# Patient Record
Sex: Male | Born: 1955 | Race: White | Hispanic: No | Marital: Married | Smoking: Never smoker
Health system: Southern US, Community
[De-identification: ages and names within clinical notes are randomized; demographics above are authoritative.]

## PROBLEM LIST (undated history)

## (undated) ENCOUNTER — Ambulatory Visit: Admission: EM | Source: Home / Self Care

## (undated) DIAGNOSIS — K4090 Unilateral inguinal hernia, without obstruction or gangrene, not specified as recurrent: Secondary | ICD-10-CM

## (undated) DIAGNOSIS — Z85820 Personal history of malignant melanoma of skin: Secondary | ICD-10-CM

## (undated) DIAGNOSIS — N3289 Other specified disorders of bladder: Secondary | ICD-10-CM

## (undated) DIAGNOSIS — Z973 Presence of spectacles and contact lenses: Secondary | ICD-10-CM

## (undated) DIAGNOSIS — M1990 Unspecified osteoarthritis, unspecified site: Secondary | ICD-10-CM

## (undated) DIAGNOSIS — D039 Melanoma in situ, unspecified: Secondary | ICD-10-CM

## (undated) DIAGNOSIS — G250 Essential tremor: Secondary | ICD-10-CM

## (undated) DIAGNOSIS — Z9889 Other specified postprocedural states: Secondary | ICD-10-CM

## (undated) DIAGNOSIS — C801 Malignant (primary) neoplasm, unspecified: Secondary | ICD-10-CM

## (undated) DIAGNOSIS — D494 Neoplasm of unspecified behavior of bladder: Secondary | ICD-10-CM

## (undated) DIAGNOSIS — F419 Anxiety disorder, unspecified: Secondary | ICD-10-CM

## (undated) HISTORY — DX: Melanoma in situ, unspecified: D03.9

## (undated) HISTORY — PX: MELANOMA EXCISION: SHX5266

## (undated) HISTORY — PX: VASECTOMY: SHX75

## (undated) HISTORY — DX: Essential tremor: G25.0

## (undated) HISTORY — PX: OTHER SURGICAL HISTORY: SHX169

---
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
## 2005-11-25 HISTORY — PX: INGUINAL HERNIA REPAIR: SUR1180

## 2005-11-25 HISTORY — PX: OTHER SURGICAL HISTORY: SHX169

## 2015-05-12 DIAGNOSIS — C434 Malignant melanoma of scalp and neck: Secondary | ICD-10-CM | POA: Insufficient documentation

## 2015-11-26 HISTORY — PX: HAND SURGERY: SHX662

## 2016-03-11 ENCOUNTER — Ambulatory Visit: Payer: Self-pay | Admitting: General Surgery

## 2016-04-09 NOTE — Patient Instructions (Signed)
Raymond Wagner  04/09/2016   Your procedure is scheduled on: 04/17/16  Report to [facility] Main  Entrance take [facility]  elevators to 3rd floor to [place] at 6:30  AM.  Call this number if you have problems the morning of surgery [phone]   Remember: ONLY 1 PERSON MAY GO WITH YOU TO SHORT STAY TO GET  READY MORNING OF [place].  Do not eat food or drink liquids :After Midnight.     Take these medicines the morning of surgery with A SIP OF WATER: Zyrtec if needed                                You may not have any metal on your body including              piercings.  Do not wear jewelry, lotions, powders or deodorant                        Men may shave face and neck.   Do not bring valuables to the hospital. [place].  Contacts, dentures or bridgework may not be worn into surgery.  Leave suitcase in the car. After surgery it may be brought to your room.     _____________________________________________________________________             [facility] - Preparing for Surgery Before surgery, you can play an important role.  Because skin is not sterile, your skin needs to be as free of germs as possible.  You can reduce the number of germs on your skin by washing with CHG (chlorahexidine gluconate) soap before surgery.  CHG is an antiseptic cleaner which kills germs and bonds with the skin to continue killing germs even after washing. Please DO NOT use if you have an allergy to CHG or antibacterial soaps.  If your skin becomes reddened/irritated stop using the CHG and inform your nurse when you arrive at Short Stay. Do not shave (including legs and underarms) for at least 48 hours prior to the first CHG shower.  You may shave your face/neck. Please follow these instructions carefully:  1.  Shower with CHG Soap the night before surgery and the  morning of Surgery.  2.  If you choose to wash your  hair, wash your hair first as usual with your  normal  shampoo.  3.  After you shampoo, rinse your hair and body thoroughly to remove the  shampoo.                           4.  Use CHG as you would any other liquid soap.  You can apply chg directly  to the skin and wash                       Gently with a scrungie or clean washcloth.  5.  Apply the CHG Soap to your body ONLY FROM THE NECK DOWN.   Do not use on face/ open                           Wound or  open sores. Avoid contact with eyes, ears mouth and genitals (private parts).                       Wash face,  Genitals (private parts) with your normal soap.             6.  Wash thoroughly, paying special attention to the area where your surgery  will be performed.  7.  Thoroughly rinse your body with warm water from the neck down.  8.  DO NOT shower/wash with your normal soap after using and rinsing off  the CHG Soap.                9.  Pat yourself dry with a clean towel.            10.  Wear clean pajamas.            11.  Place clean sheets on your bed the night of your first shower and do not  sleep with pets. Day of Surgery : Do not apply any lotions/deodorants the morning of surgery.  Please wear clean clothes to the hospital/surgery center.  FAILURE TO FOLLOW THESE INSTRUCTIONS MAY RESULT IN THE CANCELLATION OF YOUR SURGERY PATIENT SIGNATURE_________________________________  NURSE SIGNATURE__________________________________  ________________________________________________________________________

## 2016-04-10 ENCOUNTER — Ambulatory Visit: Payer: Self-pay | Admitting: General Surgery

## 2016-04-12 ENCOUNTER — Encounter (HOSPITAL_COMMUNITY): Payer: Self-pay

## 2016-04-12 ENCOUNTER — Encounter (HOSPITAL_COMMUNITY)
Admission: RE | Admit: 2016-04-12 | Discharge: 2016-04-12 | Disposition: A | Discharge status: Home / Self Care | Payer: 59 | Source: Ambulatory Visit | Attending: General Surgery | Admitting: General Surgery

## 2016-04-12 DIAGNOSIS — Z01812 Encounter for preprocedural laboratory examination: Secondary | ICD-10-CM | POA: Insufficient documentation

## 2016-04-12 DIAGNOSIS — K4090 Unilateral inguinal hernia, without obstruction or gangrene, not specified as recurrent: Secondary | ICD-10-CM | POA: Diagnosis not present

## 2016-04-12 HISTORY — DX: Malignant (primary) neoplasm, unspecified: C80.1

## 2016-04-12 LAB — CBC
HCT: 39.1 % (ref 39.0–52.0)
Hemoglobin: 13.1 g/dL (ref 13.0–17.0)
MCH: 31.1 pg (ref 26.0–34.0)
MCHC: 33.5 g/dL (ref 30.0–36.0)
MCV: 92.9 fL (ref 78.0–100.0)
PLATELETS: 161 10*3/uL (ref 150–400)
RBC: 4.21 MIL/uL — ABNORMAL LOW (ref 4.22–5.81)
RDW: 13 % (ref 11.5–15.5)
WBC: 5.3 10*3/uL (ref 4.0–10.5)

## 2016-04-16 ENCOUNTER — Encounter (HOSPITAL_BASED_OUTPATIENT_CLINIC_OR_DEPARTMENT_OTHER): Payer: Self-pay | Admitting: *Deleted

## 2016-04-16 NOTE — Progress Notes (Signed)
INFORMED PT OF FACILITY CHANGE. NPO AFTER MN.  ARRIVE AT 0700.  CURRENT LAB RESULT IN CHART AND EPIC.  WILL DO HIBICLENS SHOWER HS BEFORE AND AM DOS.

## 2016-04-17 ENCOUNTER — Ambulatory Visit (HOSPITAL_BASED_OUTPATIENT_CLINIC_OR_DEPARTMENT_OTHER): Payer: 59 | Admitting: Anesthesiology

## 2016-04-17 ENCOUNTER — Encounter (HOSPITAL_BASED_OUTPATIENT_CLINIC_OR_DEPARTMENT_OTHER)
Admission: RE | Disposition: A | Discharge status: Home / Self Care | Payer: Self-pay | Source: Ambulatory Visit | Attending: General Surgery

## 2016-04-17 ENCOUNTER — Ambulatory Visit (HOSPITAL_BASED_OUTPATIENT_CLINIC_OR_DEPARTMENT_OTHER)
Admission: RE | Admit: 2016-04-17 | Discharge: 2016-04-17 | Disposition: A | Discharge status: Home / Self Care | Payer: 59 | Source: Ambulatory Visit | Attending: General Surgery | Admitting: General Surgery

## 2016-04-17 ENCOUNTER — Encounter (HOSPITAL_BASED_OUTPATIENT_CLINIC_OR_DEPARTMENT_OTHER): Payer: Self-pay | Admitting: Anesthesiology

## 2016-04-17 DIAGNOSIS — Z79899 Other long term (current) drug therapy: Secondary | ICD-10-CM | POA: Diagnosis not present

## 2016-04-17 DIAGNOSIS — D176 Benign lipomatous neoplasm of spermatic cord: Secondary | ICD-10-CM | POA: Diagnosis not present

## 2016-04-17 DIAGNOSIS — Z7982 Long term (current) use of aspirin: Secondary | ICD-10-CM | POA: Diagnosis not present

## 2016-04-17 DIAGNOSIS — Z85820 Personal history of malignant melanoma of skin: Secondary | ICD-10-CM | POA: Insufficient documentation

## 2016-04-17 DIAGNOSIS — K4090 Unilateral inguinal hernia, without obstruction or gangrene, not specified as recurrent: Secondary | ICD-10-CM | POA: Insufficient documentation

## 2016-04-17 HISTORY — DX: Other specified postprocedural states: Z98.890

## 2016-04-17 HISTORY — DX: Personal history of malignant melanoma of skin: Z85.820

## 2016-04-17 HISTORY — DX: Unilateral inguinal hernia, without obstruction or gangrene, not specified as recurrent: K40.90

## 2016-04-17 HISTORY — PX: INSERTION OF MESH: SHX5868

## 2016-04-17 HISTORY — PX: INGUINAL HERNIA REPAIR: SHX194

## 2016-04-17 SURGERY — REPAIR, HERNIA, INGUINAL, ADULT
Anesthesia: General | Site: Inguinal | Laterality: Left

## 2016-04-17 MED ORDER — MIDAZOLAM HCL 2 MG/2ML IJ SOLN
INTRAMUSCULAR | Status: AC
  Filled 2016-04-17: qty 2

## 2016-04-17 MED ORDER — PROPOFOL 10 MG/ML IV BOLUS
INTRAVENOUS | Status: AC
  Filled 2016-04-17: qty 40

## 2016-04-17 MED ORDER — LIDOCAINE HCL (CARDIAC) 20 MG/ML IV SOLN
INTRAVENOUS | Status: AC
  Filled 2016-04-17: qty 10

## 2016-04-17 MED ORDER — MIDAZOLAM HCL 5 MG/5ML IJ SOLN
INTRAMUSCULAR | Status: DC | PRN
  Administered 2016-04-17: 2 mg via INTRAVENOUS

## 2016-04-17 MED ORDER — CEFAZOLIN SODIUM-DEXTROSE 2-4 GM/100ML-% IV SOLN
2.0000 g | INTRAVENOUS | Status: AC
  Administered 2016-04-17: 2 g via INTRAVENOUS
  Filled 2016-04-17: qty 100

## 2016-04-17 MED ORDER — LIDOCAINE HCL (CARDIAC) 20 MG/ML IV SOLN
INTRAVENOUS | Status: AC
  Filled 2016-04-17: qty 15

## 2016-04-17 MED ORDER — FENTANYL CITRATE (PF) 100 MCG/2ML IJ SOLN
INTRAMUSCULAR | Status: DC | PRN
  Administered 2016-04-17: 50 ug via INTRAVENOUS

## 2016-04-17 MED ORDER — ONDANSETRON HCL 4 MG/2ML IJ SOLN
INTRAMUSCULAR | Status: AC
  Filled 2016-04-17: qty 2

## 2016-04-17 MED ORDER — HYDROMORPHONE HCL 1 MG/ML IJ SOLN
0.2500 mg | INTRAMUSCULAR | Status: DC | PRN
  Filled 2016-04-17: qty 1

## 2016-04-17 MED ORDER — OXYCODONE-ACETAMINOPHEN 5-325 MG PO TABS: 1.0000 | ORAL_TABLET | ORAL | Status: DC | PRN

## 2016-04-17 MED ORDER — LACTATED RINGERS IV SOLN
INTRAVENOUS | Status: DC
  Administered 2016-04-17 (×2): via INTRAVENOUS
  Filled 2016-04-17: qty 1000

## 2016-04-17 MED ORDER — ARTIFICIAL TEARS OP OINT
TOPICAL_OINTMENT | OPHTHALMIC | Status: AC
  Filled 2016-04-17: qty 10.5

## 2016-04-17 MED ORDER — EPHEDRINE SULFATE 50 MG/ML IJ SOLN
INTRAMUSCULAR | Status: AC
  Filled 2016-04-17: qty 1

## 2016-04-17 MED ORDER — KETOROLAC TROMETHAMINE 30 MG/ML IJ SOLN
30.0000 mg | Freq: Once | INTRAMUSCULAR | Status: DC | PRN
  Filled 2016-04-17: qty 1

## 2016-04-17 MED ORDER — PROPOFOL 10 MG/ML IV BOLUS
INTRAVENOUS | Status: DC | PRN
  Administered 2016-04-17: 200 mg via INTRAVENOUS

## 2016-04-17 MED ORDER — DEXAMETHASONE SODIUM PHOSPHATE 4 MG/ML IJ SOLN
INTRAMUSCULAR | Status: DC | PRN
  Administered 2016-04-17: 10 mg via INTRAVENOUS

## 2016-04-17 MED ORDER — DEXAMETHASONE SODIUM PHOSPHATE 10 MG/ML IJ SOLN
INTRAMUSCULAR | Status: AC
  Filled 2016-04-17: qty 1

## 2016-04-17 MED ORDER — HYDROCODONE-ACETAMINOPHEN 7.5-325 MG PO TABS
1.0000 | ORAL_TABLET | Freq: Once | ORAL | Status: DC | PRN
  Filled 2016-04-17: qty 1

## 2016-04-17 MED ORDER — FENTANYL CITRATE (PF) 100 MCG/2ML IJ SOLN
INTRAMUSCULAR | Status: AC
  Filled 2016-04-17: qty 4

## 2016-04-17 MED ORDER — PROMETHAZINE HCL 25 MG/ML IJ SOLN
6.2500 mg | INTRAMUSCULAR | Status: DC | PRN
  Filled 2016-04-17: qty 1

## 2016-04-17 MED ORDER — KETOROLAC TROMETHAMINE 30 MG/ML IJ SOLN
INTRAMUSCULAR | Status: AC
  Filled 2016-04-17: qty 1

## 2016-04-17 MED ORDER — LIDOCAINE 2% (20 MG/ML) 5 ML SYRINGE
INTRAMUSCULAR | Status: DC | PRN
  Administered 2016-04-17: 60 mg via INTRAVENOUS

## 2016-04-17 MED ORDER — ONDANSETRON HCL 4 MG/2ML IJ SOLN
INTRAMUSCULAR | Status: DC | PRN
  Administered 2016-04-17: 4 mg via INTRAVENOUS

## 2016-04-17 MED ORDER — BUPIVACAINE-EPINEPHRINE 0.25% -1:200000 IJ SOLN
INTRAMUSCULAR | Status: DC | PRN
  Administered 2016-04-17: 10 mL

## 2016-04-17 MED ORDER — SODIUM CHLORIDE 0.9 % IV SOLN
INTRAVENOUS | Status: DC | PRN
  Administered 2016-04-17: 40 mL

## 2016-04-17 MED ORDER — CEFAZOLIN SODIUM-DEXTROSE 2-4 GM/100ML-% IV SOLN
INTRAVENOUS | Status: AC
  Filled 2016-04-17: qty 100

## 2016-04-17 MED ORDER — CHLORHEXIDINE GLUCONATE 4 % EX LIQD
1.0000 "application " | Freq: Once | CUTANEOUS | Status: DC
  Filled 2016-04-17: qty 15

## 2016-04-17 MED ORDER — IBUPROFEN 800 MG PO TABS: 800.0000 mg | ORAL_TABLET | Freq: Three times a day (TID) | ORAL | Status: DC | PRN

## 2016-04-17 MED ORDER — EPHEDRINE SULFATE-NACL 50-0.9 MG/10ML-% IV SOSY
PREFILLED_SYRINGE | INTRAVENOUS | Status: DC | PRN
  Administered 2016-04-17: 15 mg via INTRAVENOUS
  Administered 2016-04-17 (×2): 10 mg via INTRAVENOUS

## 2016-04-17 MED ORDER — ROCURONIUM BROMIDE 100 MG/10ML IV SOLN
INTRAVENOUS | Status: AC
Start: 2016-04-17 — End: 2016-04-17
  Filled 2016-04-17: qty 1

## 2016-04-17 SURGICAL SUPPLY — 47 items
BLADE CLIPPER SENSICLIP SURGIC (BLADE) ×3 IMPLANT
BLADE HEX COATED 2.75 (ELECTRODE) ×3 IMPLANT
CANISTER SUCTION 2500CC (MISCELLANEOUS) IMPLANT
CELLS DAT CNTRL 66122 CELL SVR (MISCELLANEOUS) IMPLANT
CHLORAPREP W/TINT 26ML (MISCELLANEOUS) ×3 IMPLANT
CONT SPECI 4OZ STER CLIK (MISCELLANEOUS) ×3 IMPLANT
COVER BACK TABLE 60X90IN (DRAPES) ×3 IMPLANT
COVER MAYO STAND STRL (DRAPES) ×3 IMPLANT
DECANTER SPIKE VIAL GLASS SM (MISCELLANEOUS) IMPLANT
DRAIN PENROSE 18X1/4 LTX STRL (WOUND CARE) IMPLANT
DRAPE LAPAROSCOPIC ABDOMINAL (DRAPES) ×3 IMPLANT
DRAPE UTILITY XL STRL (DRAPES) ×3 IMPLANT
ELECT REM PT RETURN 9FT ADLT (ELECTROSURGICAL) ×3
ELECTRODE REM PT RTRN 9FT ADLT (ELECTROSURGICAL) ×1 IMPLANT
GLOVE BIOGEL PI IND STRL 7.0 (GLOVE) ×1 IMPLANT
GLOVE BIOGEL PI IND STRL 7.5 (GLOVE) ×3 IMPLANT
GLOVE BIOGEL PI INDICATOR 7.0 (GLOVE) ×2
GLOVE BIOGEL PI INDICATOR 7.5 (GLOVE) ×6
GLOVE SURG SS PI 7.0 STRL IVOR (GLOVE) ×3 IMPLANT
GOWN STRL REUS W/ TWL LRG LVL3 (GOWN DISPOSABLE) ×1 IMPLANT
GOWN STRL REUS W/TWL LRG LVL3 (GOWN DISPOSABLE) ×5 IMPLANT
KIT ROOM TURNOVER WOR (KITS) ×3 IMPLANT
LIQUID BAND (GAUZE/BANDAGES/DRESSINGS) ×3 IMPLANT
MESH BARD SOFT 3X6IN (Mesh General) ×3 IMPLANT
NEEDLE HYPO 25X1 1.5 SAFETY (NEEDLE) ×3 IMPLANT
NS IRRIG 500ML POUR BTL (IV SOLUTION) ×3 IMPLANT
PACK BASIN DAY SURGERY FS (CUSTOM PROCEDURE TRAY) ×3 IMPLANT
PAD ARMBOARD 7.5X6 YLW CONV (MISCELLANEOUS) ×3 IMPLANT
PENCIL BUTTON HOLSTER BLD 10FT (ELECTRODE) ×3 IMPLANT
RTRCTR WOUND ALEXIS 18CM MED (MISCELLANEOUS)
RTRCTR WOUND ALEXIS 18CM SML (INSTRUMENTS)
SAVER CELL AAL HAEMONETICS (INSTRUMENTS) IMPLANT
SPONGE LAP 4X18 X RAY DECT (DISPOSABLE) IMPLANT
SUT MNCRL AB 4-0 PS2 18 (SUTURE) ×3 IMPLANT
SUT PROLENE 2 0 CT2 30 (SUTURE) ×9 IMPLANT
SUT SILK 3 0 SH 30 (SUTURE) ×3 IMPLANT
SUT VIC AB 2-0 SH 27 (SUTURE) ×6
SUT VIC AB 2-0 SH 27X BRD (SUTURE) ×2 IMPLANT
SUT VIC AB 2-0 SH 27XBRD (SUTURE) ×1 IMPLANT
SUT VIC AB 3-0 SH 27 (SUTURE) ×2
SUT VIC AB 3-0 SH 27X BRD (SUTURE) ×1 IMPLANT
SYR BULB IRRIGATION 50ML (SYRINGE) ×3 IMPLANT
SYR CONTROL 10ML LL (SYRINGE) ×6 IMPLANT
TOWEL OR 17X24 6PK STRL BLUE (TOWEL DISPOSABLE) ×6 IMPLANT
TUBE CONNECTING 12'X1/4 (SUCTIONS) ×1
TUBE CONNECTING 12X1/4 (SUCTIONS) ×2 IMPLANT
YANKAUER SUCT BULB TIP NO VENT (SUCTIONS) ×3 IMPLANT

## 2016-04-17 NOTE — Op Note (Signed)
Preop diagnosis: left inguinal hernia  Postop diagnosis: left direct inguinal hernia  Procedure: open Left inguinal hernia repair with mesh  Surgeon: Gurney Maxin, M.D.  Asst: none  Anesthesia: Gen.   Indications for procedure: Raymond Wagner is a 60 y.o. male with symptoms of pain and enlarging Left inguinal hernia(s). After discussing risks, alternatives and benefits he decided on open repair and was brought to day surgery for repair.  Description of procedure: The patient was brought into the operative suite, placed supine. Anesthesia was administered with endotracheal tube. Patient was strapped in place. The patient was prepped and draped in the usual sterile fashion.  The anterior superior iliac spine and pubic tubercle were identified on the Left side. An incision was made 1cm above the connecting line, representative of the location of the inguinal ligament. The subcutaneous tissue was bluntly dissected, scarpa's fascia was dissected away. The external abdominal oblique fascia was identified and sharply opened down to the external inguinal ring. The conjoint tendon and inguinal ligament were identified. The cord structures and sac were dissected free of the surrounding tissue in 360 degrees. A penrose drain was used to encircle the contents. The cremasteric fibers were dissected free of the contents of the cord and hernia sac. The cord structures (vessels and vas deferens) were identified and carefully dissected away from the hernia sac. The hernia was in the direct space. There was also a lipoma of the cord dissected free of the cord and removed. Preperitoneal fat was identified showing appropriate dissection. The superior aspect of the external oblique fascia cut showed the ilioinguinal nerve. Due to concern of this lying over the mesh this was ligated with a 3-0 silk and cut. The floor was reinforced first by suturing the conjoint tendon to the inguinal ligament in running fashion with a  2-0 prolene. A 3x6 Bard Soft mesh was then used to close the defect and reinforce the floor. The mesh was sutured to the lacunar ligament and inguinal ligament using a 2-0 prolene in running fashion. Next the superior edge of the mesh was sutured to the conjoined tendon using a 2-0 running Prolene. An additional 2-0 Prolene was used to suture the tail ends of the mesh together re-creating the deep ring. Cord structures are running in a neutral position through the mesh. Next the external abdominal oblique fascia was closed with a 2-0 Vicryl in running fashion to re-create the external inguinal ring. Scarpa's fascia was closed with 3-0 Vicryl in running fashion. Skin was closed with a 4-0 Monocryl subcuticular stitch in running fashion. Dermabond place for dressing. Patient woke from anesthesia and brought to PACU in stable condition. All counts are correct.    Findings: left direct inguinal hernia  Specimen: none  Blood loss: <50 ml  Local anesthesia: 40 ml 1:1 Exparel:Saline, 77ml 0.25% marcaine w epi  Complications: none  Implant: 7x15cm bard soft mesh  Gurney Maxin, M.D. General, Bariatric, & Minimally Invasive Surgery [facility] Surgery, [place] 9:43 AM 04/17/2016

## 2016-04-17 NOTE — Anesthesia Postprocedure Evaluation (Signed)
Anesthesia Post Note  Patient: Raymond Wagner  Procedure(s) Performed: Procedure(s) (LRB): OPEN LEFT IINGUINAL HERNIA REPAIR (Left) INSERTION OF MESH (Left)  Patient location during evaluation: PACU Anesthesia Type: General Level of consciousness: awake and alert Pain management: pain level controlled Vital Signs Assessment: post-procedure vital signs reviewed and stable Respiratory status: spontaneous breathing, nonlabored ventilation, respiratory function stable and patient connected to nasal cannula oxygen Cardiovascular status: blood pressure returned to baseline and stable Postop Assessment: no signs of nausea or vomiting Anesthetic complications: no    Last Vitals:  Filed Vitals:   04/17/16 1030 04/17/16 1045  BP: 121/69 122/69  Pulse: 63 67  Temp:    Resp: 14 11    Last Pain: There were no vitals filed for this visit.               Tiajuana Amass

## 2016-04-17 NOTE — H&P (Signed)
History of Present Illness Patient words: New- LIH.  The patient is a 60 year old male who presents with an inguinal hernia. Referred by Shirline Frees. He has a 2 month history of noticing a bulge at his left groin. He had a previous right hernia repair 10 years ago by open fashion. Currently, he has no symptoms on the right side. Left side daily. He does heavy lifting at work and at the end of that they will have a burning sensation in that area. He denies nausea or vomiting. He has not had to manually reduce the hernia.  He does not smoke, he is not overweight, does not have diabetes, and he has no known collagen deficiency disorders.   Other Problems Hemorrhoids Inguinal Hernia Melanoma  Past Surgical HistoryOpen Inguinal Hernia Surgery Right. Vasectomy  Diagnostic Studies History Colonoscopy 1-5 years ago  Allergies  No Known Drug Allergies03/24/2017  Medication HistoryTriamcinolone Acetonide (0.1% Ointment, External) Active. Multivitamin Adult (Oral) Active. Aspirin (81MG  Tablet, Oral) Active. ZyrTEC (10MG  Tablet, Oral) Active. Medications Reconciled  Social History Alcohol use Occasional alcohol use. Caffeine use Carbonated beverages, Tea. No drug use Tobacco use Never smoker.  Family History  Arthritis Mother. Cancer Father. Diabetes Mellitus Sister.    Review of Systems  General Not Present- Appetite Loss, Chills, Fatigue, Fever, Night Sweats, Weight Gain and Weight Loss. Skin Not Present- Change in Wart/Mole, Dryness, Hives, Jaundice, New Lesions, Non-Healing Wounds, Rash and Ulcer. HEENT Present- Wears glasses/contact lenses. Not Present- Earache, Hearing Loss, Hoarseness, Nose Bleed, Oral Ulcers, Ringing in the Ears, Seasonal Allergies, Sinus Pain, Sore Throat, Visual Disturbances and Yellow Eyes. Respiratory Not Present- Bloody sputum, Chronic Cough, Difficulty Breathing, Snoring and Wheezing. Breast Not Present- Breast Mass, Breast  Pain, Nipple Discharge and Skin Changes. Cardiovascular Not Present- Chest Pain, Difficulty Breathing Lying Down, Leg Cramps, Palpitations, Rapid Heart Rate, Shortness of Breath and Swelling of Extremities. Gastrointestinal Present- Hemorrhoids. Not Present- Abdominal Pain, Bloating, Bloody Stool, Change in Bowel Habits, Chronic diarrhea, Constipation, Difficulty Swallowing, Excessive gas, Gets full quickly at meals, Indigestion, Nausea, Rectal Pain and Vomiting. Male Genitourinary Not Present- Blood in Urine, Change in Urinary Stream, Frequency, Impotence, Nocturia, Painful Urination, Urgency and Urine Leakage. Musculoskeletal Not Present- Back Pain, Joint Pain, Joint Stiffness, Muscle Pain, Muscle Weakness and Swelling of Extremities. Neurological Not Present- Decreased Memory, Fainting, Headaches, Numbness, Seizures, Tingling, Tremor, Trouble walking and Weakness. Psychiatric Not Present- Anxiety, Bipolar, Change in Sleep Pattern, Depression, Fearful and Frequent crying. Endocrine Not Present- Cold Intolerance, Excessive Hunger, Hair Changes, Heat Intolerance, Hot flashes and New Diabetes. Hematology Not Present- Easy Bruising, Excessive bleeding, Gland problems, HIV and Persistent Infections.  Vitals  BP 143/76 mmHg  Pulse 71  Temp(Src) 97.9 F (36.6 C) (Oral)  Resp 16  Wt 74.39 kg (164 lb)  SpO2 100%  Physical Exam  General Mental Status-Alert. General Appearance-Cooperative. Orientation-Oriented X4. Build & Nutrition-Obese. Posture-Normal posture.  Integumentary Global Assessment Normal Exam - Head/Face: no rashes, ulcers, lesions or evidence of photo damage. No palpable nodules or masses and Neck: no visible lesions or palpable masses.  Head and Neck Head-normocephalic, atraumatic with no lesions or palpable masses. Face Global Assessment - atraumatic. Thyroid Gland Characteristics - normal size and consistency.  Eye Eyeball - Bilateral-Extraocular  movements intact. Sclera/Conjunctiva - Bilateral-No scleral icterus, No Discharge.  ENMT Nose and Sinuses Nose - no deformities observed, no swelling present.  Chest and Lung Exam Palpation Normal exam - Non-tender. Auscultation Breath sounds - Normal.  Cardiovascular Auscultation Rhythm - Regular. Heart  Sounds - S1 WNL and S2 WNL. Carotid arteries - No Carotid bruit.  Abdomen Inspection Normal Exam - No Visible peristalsis, No Abnormal pulsations and No Paradoxical movements. Palpation/Percussion Normal exam - Soft, Non Tender, No Rebound tenderness, No Rigidity (guarding), No hepatosplenomegaly and No Palpable abdominal masses. Note: Right groin scar well-healed, no right fascial defect. Left groin with palpable bulge and 1.5 cm fascial defect once reduced in supine position.   Peripheral Vascular Upper Extremity Palpation - Pulses bilaterally normal. Lower Extremity Palpation - Edema - Bilateral - No edema.  Neurologic Neurologic evaluation reveals -normal sensation and normal coordination.  Neuropsychiatric Mental status exam performed with findings of-able to articulate well with normal speech/language, rate, volume and coherence and thought content normal with ability to perform basic computations and apply abstract reasoning.  Musculoskeletal Normal Exam - Bilateral-Upper Extremity Strength Normal and Lower Extremity Strength Normal.  Assessment & Plan  LEFT INGUINAL HERNIA (K40.90) Impression: 60 year old male with left inguinal hernia causing symptoms 3-5 times per week The patient has a symptomatic reducible hernia. We discussed the etiology of his hernia, the risk of it enlarging, incarceration, obstruction, strangulation, and that it is unlikely to get smaller or better on its own. We discussed operative options of laparoscopic vs open repair with mesh including the risks of recurrence, injury to intestines or abominal organs, chronic pain associated  with mesh, ischemic orchiditis or testicular injury. We decided to proceed with left open inguinal hernia. His wife is undergoing surgery at this month and he is concerned about leaving work and may delay the surgery for a few months to figure things out. Current Plans You are being scheduled for surgery - Our schedulers will call you.  You should hear from our office's scheduling department within 5 working days about the location, date, and time of surgery. We try to make accommodations for patient's preferences in scheduling surgery, but sometimes the OR schedule or the surgeon's schedule prevents [place] from making those accommodations.  If you have not heard from our office [phone]) in 5 working days, call the office and ask for your surgeon's nurse.  If you have other questions about your diagnosis, plan, or surgery, call the office and ask for your surgeon's nurse.  Pt Education - Pamphlet Given - Hernia Surgery: discussed with patient and provided information. Pt Education - Consent for inguinal hernia - Alanmichael Barmore: discussed with patient and provided information.

## 2016-04-17 NOTE — Transfer of Care (Signed)
Immediate Anesthesia Transfer of Care Note  Patient: Raymond Wagner  Procedure(s) Performed: Procedure(s): OPEN LEFT IINGUINAL HERNIA REPAIR (Left) INSERTION OF MESH (Left)  Patient Location: PACU  Anesthesia Type:General  Level of Consciousness: awake, alert , oriented and patient cooperative  Airway & Oxygen Therapy: Patient Spontanous Breathing and Patient connected to nasal cannula oxygen  Post-op Assessment: Report given to RN and Post -op Vital signs reviewed and stable  Post vital signs: Reviewed and stable  Last Vitals:  Filed Vitals:   04/17/16 0715 04/17/16 0951  BP: 143/76 127/58  Pulse: 71 72  Temp: 36.6 C 36.3 C  Resp: 16 14    Last Pain: There were no vitals filed for this visit.    Patients Stated Pain Goal: 4 ([id number])  Complications: No apparent anesthesia complications

## 2016-04-17 NOTE — Interval H&P Note (Signed)
History and Physical Interval Note:  04/17/2016 8:14 AM  Raymond Wagner  has presented today for surgery, with the diagnosis of left inguinal hernia  The various methods of treatment have been discussed with the patient and family. After consideration of risks, benefits and other options for treatment, the patient has consented to  Procedure(s): OPEN LEFT [place] (Left) INSERTION OF MESH (Left) as a surgical intervention .  The patient's history has been reviewed, patient examined, no change in status, stable for surgery.  I have reviewed the patient's chart and labs.  Questions were answered to the patient's satisfaction.     Arta Bruce [name]

## 2016-04-17 NOTE — Anesthesia Procedure Notes (Signed)
Procedure Name: LMA Insertion Date/Time: 04/17/2016 8:27 AM Performed by: Wanita Chamberlain Pre-anesthesia Checklist: Patient identified, Timeout performed, Emergency Drugs available, Suction available and Patient being monitored Patient Re-evaluated:Patient Re-evaluated prior to inductionOxygen Delivery Method: Circle system utilized Preoxygenation: Pre-oxygenation with 100% oxygen Intubation Type: IV induction Ventilation: Mask ventilation without difficulty LMA: LMA inserted LMA Size: 5.0 Number of attempts: 1 Placement Confirmation: breath sounds checked- equal and bilateral and positive ETCO2 Tube secured with: Tape Dental Injury: Teeth and Oropharynx as per pre-operative assessment

## 2016-04-17 NOTE — Anesthesia Preprocedure Evaluation (Addendum)
Anesthesia Evaluation  Patient identified by MRN, date of birth, ID band Patient awake    Reviewed: Allergy & Precautions, NPO status , Patient's Chart, lab work & pertinent test results  Airway Mallampati: II  TM Distance: >3 FB Neck ROM: Full    Dental  (+) Dental Advisory Given,    Pulmonary neg pulmonary ROS,    Pulmonary exam normal        Cardiovascular negative cardio ROS   Rhythm:Regular Rate:Normal     Neuro/Psych negative neurological ROS     GI/Hepatic negative GI ROS, Neg liver ROS,   Endo/Other  negative endocrine ROS  Renal/GU negative Renal ROS     Musculoskeletal   Abdominal   Peds  Hematology negative hematology ROS (+)   Anesthesia Other Findings   Reproductive/Obstetrics                          Lab Results  Component Value Date   WBC 5.3 04/12/2016   HGB 13.1 04/12/2016   HCT 39.1 04/12/2016   MCV 92.9 04/12/2016   PLT 161 04/12/2016   No results found for: CREATININE, BUN, NA, K, CL, CO2  Anesthesia Physical Anesthesia Plan  ASA: I  Anesthesia Plan: General   Post-op Pain Management:    Induction: Intravenous  Airway Management Planned: Oral ETT and LMA  Additional Equipment:   Intra-op Plan:   Post-operative Plan: Extubation in OR  Informed Consent: I have reviewed the patients History and Physical, chart, labs and discussed the procedure including the risks, benefits and alternatives for the proposed anesthesia with the patient or authorized representative who has indicated his/her understanding and acceptance.   Dental advisory given  Plan Discussed with: CRNA  Anesthesia Plan Comments:         Anesthesia Quick Evaluation

## 2016-04-17 NOTE — Discharge Instructions (Signed)
°  Post Anesthesia Home Care Instructions  Activity: Get plenty of rest for the remainder of the day. A responsible adult should stay with you for 24 hours following the procedure.  For the next 24 hours, DO NOT: -Drive a car -Operate machinery -Drink alcoholic beverages -Take any medication unless instructed by your physician -Make any legal decisions or sign important papers.  Meals: Start with liquid foods such as gelatin or soup. Progress to regular foods as tolerated. Avoid greasy, spicy, heavy foods. If nausea and/or vomiting occur, drink only clear liquids until the nausea and/or vomiting subsides. Call your physician if vomiting continues.  Special Instructions/Symptoms: Your throat may feel dry or sore from the anesthesia or the breathing tube placed in your throat during surgery. If this causes discomfort, gargle with warm salt water. The discomfort should disappear within 24 hours.  If you had a scopolamine patch placed behind your ear for the management of post- operative nausea and/or vomiting:  1. The medication in the patch is effective for 72 hours, after which it should be removed.  Wrap patch in a tissue and discard in the trash. Wash hands thoroughly with soap and water. 2. You may remove the patch earlier than 72 hours if you experience unpleasant side effects which may include dry mouth, dizziness or visual disturbances. 3. Avoid touching the patch. Wash your hands with soap and water after contact with the patch.   Information for Discharge Teaching: EXPAREL (bupivacaine liposome injectable suspension)   Your surgeon gave you EXPAREL(bupivacaine) in your surgical incision to help control your pain after surgery.   EXPAREL is a local anesthetic that provides pain relief by numbing the tissue around the surgical site.  EXPAREL is designed to release pain medication over time and can control pain for up to 72 hours.  Depending on how you respond to EXPAREL, you may  require less pain medication during your recovery.  Possible side effects:  Temporary loss of sensation or ability to move in the area where bupivacaine was injected.  Nausea, vomiting, constipation  Rarely, numbness and tingling in your mouth or lips, lightheadedness, or anxiety may occur.  Call your doctor right away if you think you may be experiencing any of these sensations, or if you have other questions regarding possible side effects.  Follow all other discharge instructions given to you by your surgeon or nurse. Eat a healthy diet and drink plenty of water or other fluids.  If you return to the hospital for any reason within 96 hours following the administration of EXPAREL, please inform your health care providers. 

## 2016-04-18 ENCOUNTER — Encounter (HOSPITAL_BASED_OUTPATIENT_CLINIC_OR_DEPARTMENT_OTHER): Payer: Self-pay | Admitting: General Surgery

## 2018-04-01 ENCOUNTER — Telehealth: Payer: Self-pay | Admitting: Neurology

## 2018-04-01 ENCOUNTER — Encounter: Payer: Self-pay | Admitting: Neurology

## 2018-04-01 ENCOUNTER — Ambulatory Visit (INDEPENDENT_AMBULATORY_CARE_PROVIDER_SITE_OTHER): Payer: BLUE CROSS/BLUE SHIELD | Admitting: Neurology

## 2018-04-01 VITALS — BP 159/86 | HR 54 | Wt 167.0 lb

## 2018-04-01 DIAGNOSIS — R251 Tremor, unspecified: Secondary | ICD-10-CM

## 2018-04-01 NOTE — Telephone Encounter (Signed)
[employer] Auth: [id number] (exp. 04/01/18 to 05/30/18) order sent to GI. They will contact the pt to schedule.

## 2018-04-01 NOTE — Patient Instructions (Signed)
Your tremor is mild, more noticeable on the left.  As discussed, I would like to follow you clinically for now, I would not recommend any new medication as yet.  We will do a brain scan, called MRI and call you with the test results. We will have to schedule you for this on a separate date. This test requires authorization from your insurance, and we will take care of the insurance process.

## 2018-04-01 NOTE — Progress Notes (Signed)
Subjective:    Patient ID: Raymond Wagner is a 62 y.o. male.  HPI     Star Age, MD, PhD [facility] [address], [address] [place], [place] [address]  Dear Dr. Kenton Kingfisher,   I saw your patient, Raymond Wagner, upon your kind request in my neurologic clinic today for initial consultation of his tremor. The patient is unaccompanied today. As you know, Raymond Wagner is a 62 year old right-handed gentleman with an underlying medical history of melanoma reports an approximately five-year history of bilateral hand tremors with recent increase noted which is bothersome to him at times. He has noted the tremor more on the left than the right and perhaps in the past year or so it seems to have progressed. He has noticed trembling when he tries to hold something. I reviewed your office note from 02/12/2018. He is currently not on any prescription medications. He has regular blood work 65-year-old office. Blood test results from 02/09/2018 were reviewed. CBC with differential was unremarkable, lipid panel showed cholesterol of 201, LDL of 118, triglycerides 95. TSH was borderline elevated at 4.58. PSA was unremarkable.  He is married and lives with his wife and daughter. He is a nonsmoker and drinks alcohol very occasionally, caffeine in the form ofoccasional soda, no coffee. He does not have a family history of tremors, father had Parkinson's disease and was diagnosed in his late 10s, lift to be 62 years old. Patient has 2 sisters, neither one with tremor. He has not noticed any changes in his walking, no difficulty swallowing, no recurrent headaches, feels sometimes weaker in both arms, has some right shoulder issues, presumed arthritis. He had injured his back when he was a teenager. He had an x-ray for this. He has never had a brain scan or head CT. He has not had any recent falls. He does not notice any balance issues, he is on his feet a lot, does not do any formal  exercise regimen but at work he is on his feet and walks a lot. 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 His handwriting is normal always very good he believes. He has not noticed any changes in his ability to speak.   His Past Medical History Is Significant For: Past Medical History:  Diagnosis Date  . Familial tremor   . History of melanoma excision    SCALP 2016  . Left inguinal hernia   . Melanoma in situ [facility])     His Past Surgical History Is Significant For: Past Surgical History:  Procedure Laterality Date  . INGUINAL HERNIA REPAIR Left 04/17/2016   Procedure: OPEN LEFT [place];  Surgeon: Mickeal Skinner, MD;  Location: [facility];  Service: General;  Laterality: Left;  . INSERTION OF MESH Left 04/17/2016   Procedure: INSERTION OF MESH;  Surgeon: Mickeal Skinner, MD;  Location: [facility];  Service: General;  Laterality: Left;  Marland Kitchen MELANOMA EXCISION  2016  and 2007   scalp area    His Family History Is Significant For: Family History  Problem Relation Age of Onset  . Hyperlipidemia Mother   . Hypertension Mother   . Bladder Cancer Father   . Parkinson's disease Father   . Diabetes Sister   . Heart attack Maternal Grandfather     His Social History Is Significant For: Social History   Socioeconomic History  . Marital status: Married    Spouse name: Not on file  . Number  of children: Not on file  . Years of education: Not on file  . Highest education level: Not on file  Occupational History  . Not on file  Social Needs  . Financial resource strain: Not on file  . Food insecurity:    Worry: Not on file    Inability: Not on file  . Transportation needs:    Medical: Not on file    Non-medical: Not on file  Tobacco Use  . Smoking status: Never Smoker  . Smokeless tobacco: Never Used  Substance and Sexual Activity  . Alcohol use: No  . Drug use: No  . Sexual activity: Not on file  Lifestyle  .  Physical activity:    Days per week: Not on file    Minutes per session: Not on file  . Stress: Not on file  Relationships  . Social connections:    Talks on phone: Not on file    Gets together: Not on file    Attends religious service: Not on file    Active member of club or organization: Not on file    Attends meetings of clubs or organizations: Not on file    Relationship status: Not on file  Other Topics Concern  . Not on file  Social History Narrative  . Not on file    His Allergies Are:  No Known Allergies:   His Current Medications Are:  Outpatient Encounter Medications as of 04/01/2018  Medication Sig  . aspirin EC 81 MG tablet Take 81 mg by mouth daily.  . cetirizine (ZYRTEC) 10 MG tablet Take 10 mg by mouth daily.  Marland Kitchen ibuprofen (ADVIL) 200 MG tablet Take 400 mg by mouth every 6 (six) hours as needed (For body aches.).  Marland Kitchen ibuprofen (ADVIL,MOTRIN) 800 MG tablet Take 1 tablet (800 mg total) by mouth every 8 (eight) hours as needed.  . Multiple Vitamin (MULTIVITAMIN WITH MINERALS) TABS tablet Take 1 tablet by mouth daily.  Marland Kitchen triamcinolone ointment (KENALOG) 0.1 % Apply 1 application topically 2 (two) times daily as needed (For eczema on legs.).   Marland Kitchen VEREGEN 15 % OINT Apply 1 application topically daily. For wart removal  . Zinc Sulfate 220 (50 Zn) MG TABS Take 220 mg by mouth 2 (two) times daily as needed. For nausea.   No facility-administered encounter medications on file as of 04/01/2018.    Review of Systems:  Out of a complete 14 point review of systems, all are reviewed and negative with the exception of these symptoms as listed below:  Review of Systems  Neurological:       Patient reports that the tremors started about 5 years ago and have gotten progressively worse or more noticeable.     Objective:  Neurological Exam  Physical Exam Physical Examination:   Vitals:   04/01/18 0948  BP: (!) 159/86  Pulse: (!) 54   General Examination: The patient is a very  pleasant 62 y.o. male in no acute distress. He appears well-developed and well-nourished and well groomed.   HEENT: Normocephalic, atraumatic, pupils are equal, round and reactive to light and accommodation. Funduscopic exam is normal with sharp disc margins noted. He has corrective eyeglasses. Extraocular tracking is good. He may have slight facial masking and slight decrease in eye blink rate, could be normal for him. Hearing is grossly intact.no dysarthria, no hypophonia. There is no lip, neck/head, jaw or voice tremor. Neck is supple or may have slight increase in nuchal tone, good range of motion passively  and actively.. There are no carotid bruits on auscultation. Oropharynx exam reveals: mild mouth dryness, adequate dental hygiene. Tongue protrudes centrally and palate elevates symmetrically.   Chest: Clear to auscultation without wheezing, rhonchi or crackles noted.  Heart: S1+S2+0, regular and normal without murmurs, rubs or gallops noted.   Abdomen: Soft, non-tender and non-distended with normal bowel sounds appreciated on auscultation.  Extremities: There is no pitting edema in the distal lower extremities bilaterally. Pedal pulses are intact.  Skin: Warm and dry without trophic changes noted.   Musculoskeletal: exam reveals no obvious joint deformities, tenderness or joint swelling or erythema.   Neurologically:  Mental status: The patient is awake, alert and oriented in all 4 spheres. His immediate and remote memory, attention, language skills and fund of knowledge are appropriate. There is no evidence of aphasia, agnosia, apraxia or anomia. Speech is clear with normal prosody and enunciation. Thought process is linear. Mood is normal and affect is normal.  Cranial nerves II - XII are as described above under HEENT exam. In addition: shoulder shrug is normal with equal shoulder height noted. Motor exam: Normal bulk, strength and tone is noted. There is no drift, tremor or rebound.   On 04/01/2018: on Archimedes spiral drawing he has minimal difficulty with the right hand, more pronounced on the left. Handwriting with the right hand is legible, quite small, not particularly tremulous.  He has a no resting tremor. He has a mild left hand postural tremor, minimal on the right, very mild action tremor bilaterally, slight intention tremor component bilaterally, no lower extremity tremor. Romberg is negative with the exception of very slight sway. Reflexes are 2+ throughout. Fine motor skills and coordination: intact with normal finger taps, normal hand movements, normal rapid alternating patting, normal foot taps and normal foot agility, with the exception of very slight difficulty with finger taps in the left hand.  Cerebellar testing: No dysmetria or intention tremor on finger to nose testing. There is no truncal or gait ataxia.  Sensory exam: intact to light touch in the upper and lower extremities.  Gait, station and balance: He stands without difficulty, posture is age-appropriate or very slightly stooped. Gait shows fairly good stride length, maybe slight decrease in pace, slight decrease in arm swing noted on the left. No difficulty turning. Balance is preserved.  Assessment and Plan:   In summary, Raymond Wagner is a very pleasant 62 y.o.-year old male with a Benign medical history, who presents for consultation of his hand tremors. He reports a 5 or 5+ year history of bilateral hand tremors, mostly on the left side with progression noted in the past year. His history and exam are not telltale for essential tremor. He does not have any family history of tremors either. Given his family history of Parkinson's disease and very subtle changes noted on examination I would like to observe him and follow him clinically. I did not suggest any medication for tremor control today. I would like to pursue a brain MRI with and without contrast to rule out any structural causes of his tremor.  He is advised that tremor is overall very mild at this point. We should continue to monitor however.  To that end, I would like to see him back in 6 months, sooner if needed. We will call him in the interim with his MRI results. I answered all his questions today and he was in agreement.  Thank you very much for allowing me to participate in the care of this  nice patient. If I can be of any further assistance to you please do not hesitate to call me at [phone].  Sincerely,   Star Age, MD, PhD

## 2018-04-14 ENCOUNTER — Ambulatory Visit
Admission: RE | Admit: 2018-04-14 | Discharge: 2018-04-14 | Disposition: A | Discharge status: Home / Self Care | Payer: 59 | Source: Ambulatory Visit | Attending: Neurology | Admitting: Neurology

## 2018-04-14 DIAGNOSIS — R251 Tremor, unspecified: Secondary | ICD-10-CM | POA: Diagnosis not present

## 2018-04-14 MED ORDER — GADOBENATE DIMEGLUMINE 529 MG/ML IV SOLN
15.0000 mL | Freq: Once | INTRAVENOUS | Status: AC | PRN
  Administered 2018-04-14: 15 mL via INTRAVENOUS

## 2018-04-16 ENCOUNTER — Telehealth: Payer: Self-pay

## 2018-04-16 NOTE — Progress Notes (Signed)
Normal MRI w/wo contrast, pls notify pt.  Raymond Age, MD, PhD [facility])

## 2018-04-16 NOTE — Telephone Encounter (Signed)
-----   Message from Star Age, MD sent at 04/16/2018 11:43 AM EDT ----- Normal MRI w/wo contrast, pls notify pt.  Star Age, MD, PhD [facility])

## 2018-04-16 NOTE — Telephone Encounter (Signed)
I called pt and advised him that his MRI brain was normal. Pt verbalized understanding of results. Pt had no questions at this time but was encouraged to call back if questions arise.

## 2018-10-05 ENCOUNTER — Ambulatory Visit: Payer: BLUE CROSS/BLUE SHIELD | Admitting: Neurology

## 2019-01-14 ENCOUNTER — Ambulatory Visit (INDEPENDENT_AMBULATORY_CARE_PROVIDER_SITE_OTHER): Payer: BLUE CROSS/BLUE SHIELD

## 2019-01-14 ENCOUNTER — Encounter (INDEPENDENT_AMBULATORY_CARE_PROVIDER_SITE_OTHER): Payer: Self-pay | Admitting: Orthopedic Surgery

## 2019-01-14 ENCOUNTER — Ambulatory Visit (INDEPENDENT_AMBULATORY_CARE_PROVIDER_SITE_OTHER): Payer: BLUE CROSS/BLUE SHIELD | Admitting: Orthopedic Surgery

## 2019-01-14 VITALS — Ht 73.0 in | Wt 167.0 lb

## 2019-01-14 DIAGNOSIS — M79671 Pain in right foot: Secondary | ICD-10-CM

## 2019-01-14 DIAGNOSIS — M205X1 Other deformities of toe(s) (acquired), right foot: Secondary | ICD-10-CM | POA: Diagnosis not present

## 2019-01-14 DIAGNOSIS — M21611 Bunion of right foot: Secondary | ICD-10-CM

## 2019-01-15 ENCOUNTER — Telehealth (INDEPENDENT_AMBULATORY_CARE_PROVIDER_SITE_OTHER): Payer: Self-pay | Admitting: Physician Assistant

## 2019-01-15 NOTE — Telephone Encounter (Signed)
Called pt and he want to know what the xray showed exactly. He states that he was told he need to have surgery and wants to know if there were any conservative options for treatment or if surgery was the only thing that he could do. Will hold for Dr. Sharol Given to address on Monday.

## 2019-01-15 NOTE — Telephone Encounter (Signed)
Patient would like for Shawn to give him a call, he has some questions for her.  [phone].  Thank you.

## 2019-01-16 ENCOUNTER — Encounter (INDEPENDENT_AMBULATORY_CARE_PROVIDER_SITE_OTHER): Payer: Self-pay | Admitting: Orthopedic Surgery

## 2019-01-16 NOTE — Progress Notes (Signed)
Office Visit Note   Patient: Raymond Wagner           Date of Birth: 15-Jul-1956           MRN: [id number] Visit Date: 01/14/2019              Requested by: Shirline Frees, MD [place], [place] [address] PCP: Shirline Frees, MD  Chief Complaint  Patient presents with  . Left Foot - Pain    NP w/bil foot pain  . Right Foot - Pain      HPI: Patient is a 63 year old gentleman who presents with increasing pain and deformity of the right foot with clawing of the lesser toes and bunion of the right great toe.  Patient has difficulty with shoe wear pain with walking at work at [employer]. .  Patient states that he will call when he wants to schedule surgery.   Assessment & Plan: Visit Diagnoses:  1. Right foot pain   2. Bunion of great toe of right foot   3. Claw toe, acquired, right     Plan: Discussed with the patient he can continue conservative treatment versus surgical intervention.  Discussed that with surgery he will be out of work for 1 to 2 months with surgery involving a chevron osteotomy of the first metatarsal Akin osteotomy of the proximal phalanx of the great toe Weil osteotomy of the second third and fourth metatarsals with a PIP resection and fusion of the second toe.  Discussed that this could be performed at the orthopedic surgical center and the risks and benefits of surgery were discussed  Follow-Up Instructions: Return if symptoms worsen or fail to improve.   Ortho Exam  Patient is alert, oriented, no adenopathy, well-dressed, normal affect, normal respiratory effort. Exam no pain with range of motion of the MTP joint great toe.  Ination patient has a good dorsalis pedis pulse the great toe and valgus deformity is underneath the second toe with fixed flexion of the PIP joint.  There is callus from rubbing in the shoe wear.  Patient has tenderness to palpation beneath the second third and fourth metatarsal heads.  There are no open ulcers no  redness no cellulitis no signs of infection  Imaging: No results found. No images are attached to the encounter.  Labs: No results found for: HGBA1C, ESRSEDRATE, CRP, LABURIC, REPTSTATUS, GRAMSTAIN, CULT, LABORGA   No results found for: ALBUMIN, PREALBUMIN, LABURIC  Body mass index is 22.03 kg/m.  Orders:  Orders Placed This Encounter  Procedures  . XR Foot 2 Views Right   No orders of the defined types were placed in this encounter.    Procedures: No procedures performed  Clinical Data: No additional findings.  ROS:  All other systems negative, except as noted in the HPI. Review of Systems  Objective: Vital Signs: Ht 6\' 1"  (1.854 m)   Wt 167 lb (75.8 kg)   BMI 22.03 kg/m   Specialty Comments:  No specialty comments available.  PMFS History: There are no active problems to display for this patient.  Past Medical History:  Diagnosis Date  . Familial tremor   . History of melanoma excision    SCALP 2016  . Left inguinal hernia   . Melanoma in situ [facility])     Family History  Problem Relation Age of Onset  . Hyperlipidemia Mother   . Hypertension Mother   . Bladder Cancer Father   . Parkinson's disease Father   .  Diabetes Sister   . Heart attack Maternal Grandfather     Past Surgical History:  Procedure Laterality Date  . INGUINAL HERNIA REPAIR Left 04/17/2016   Procedure: OPEN LEFT [place];  Surgeon: Mickeal Skinner, MD;  [facility];  Service: General;  Laterality: Left;  . INSERTION OF MESH Left 04/17/2016   Procedure: INSERTION OF MESH;  Surgeon: Mickeal Skinner, MD;  Location: [facility];  Service: General;  Laterality: Left;  Marland Kitchen MELANOMA EXCISION  2016  and 2007   scalp area   Social History   Occupational History  . Not on file  Tobacco Use  . Smoking status: Never Smoker  . Smokeless tobacco: Never Used  Substance and Sexual Activity  . Alcohol use: No  . Drug use: No    . Sexual activity: Not on file

## 2019-01-18 NOTE — Telephone Encounter (Signed)
I called and left a message regarding continuing conservative therapy and if he fails conservative therapy we could proceed with bunion and claw toe surgery.

## 2019-01-18 NOTE — Telephone Encounter (Signed)
Please see message below and advise.

## 2020-06-07 ENCOUNTER — Other Ambulatory Visit: Payer: Self-pay | Admitting: Urology

## 2020-06-07 MED ORDER — GEMCITABINE CHEMO FOR BLADDER INSTILLATION 2000 MG: 2000.0000 mg | Freq: Once | INTRAVENOUS | Status: DC

## 2020-06-29 ENCOUNTER — Other Ambulatory Visit: Payer: Self-pay

## 2020-06-29 ENCOUNTER — Encounter (HOSPITAL_BASED_OUTPATIENT_CLINIC_OR_DEPARTMENT_OTHER): Payer: Self-pay | Admitting: Urology

## 2020-06-29 NOTE — Progress Notes (Signed)
Spoke w/ via phone for pre-op interview---PT Lab needs dos----  none             Lab results------none COVID test ------07-08-2020 at 1130 am (due to work schedule) Arrive at -------630 am 07-10-2020 NPO after MN NO Solid Food.  Clear liquids from MN until---530 am then npo Medications to take morning of surgery -----none Diabetic medication -----n/a Patient Special Instructions -----none Pre-Op special Istructions -----none Patient verbalized understanding of instructions that were given at this phone interview. Patient denies shortness of breath, chest pain, fever, cough at this phone interview.

## 2020-07-08 ENCOUNTER — Other Ambulatory Visit (HOSPITAL_COMMUNITY)
Admission: RE | Admit: 2020-07-08 | Discharge: 2020-07-08 | Disposition: A | Discharge status: Home / Self Care | Payer: BC Managed Care – PPO | Source: Ambulatory Visit | Attending: Urology | Admitting: Urology

## 2020-07-08 DIAGNOSIS — Z01812 Encounter for preprocedural laboratory examination: Secondary | ICD-10-CM | POA: Insufficient documentation

## 2020-07-08 DIAGNOSIS — Z20822 Contact with and (suspected) exposure to covid-19: Secondary | ICD-10-CM | POA: Diagnosis not present

## 2020-07-08 LAB — SARS CORONAVIRUS 2 (TAT 6-24 HRS): SARS Coronavirus 2: NEGATIVE

## 2020-07-10 ENCOUNTER — Encounter (HOSPITAL_BASED_OUTPATIENT_CLINIC_OR_DEPARTMENT_OTHER)
Admission: RE | Disposition: A | Discharge status: Home / Self Care | Payer: Self-pay | Source: Home / Self Care | Attending: Urology

## 2020-07-10 ENCOUNTER — Ambulatory Visit (HOSPITAL_BASED_OUTPATIENT_CLINIC_OR_DEPARTMENT_OTHER): Payer: BC Managed Care – PPO | Admitting: Anesthesiology

## 2020-07-10 ENCOUNTER — Ambulatory Visit (HOSPITAL_BASED_OUTPATIENT_CLINIC_OR_DEPARTMENT_OTHER)
Admission: RE | Admit: 2020-07-10 | Discharge: 2020-07-10 | Disposition: A | Discharge status: Home / Self Care | Payer: BC Managed Care – PPO | Attending: Urology | Admitting: Urology

## 2020-07-10 ENCOUNTER — Encounter (HOSPITAL_BASED_OUTPATIENT_CLINIC_OR_DEPARTMENT_OTHER): Payer: Self-pay | Admitting: Urology

## 2020-07-10 DIAGNOSIS — C679 Malignant neoplasm of bladder, unspecified: Secondary | ICD-10-CM | POA: Diagnosis not present

## 2020-07-10 DIAGNOSIS — Z7982 Long term (current) use of aspirin: Secondary | ICD-10-CM | POA: Insufficient documentation

## 2020-07-10 DIAGNOSIS — Z85820 Personal history of malignant melanoma of skin: Secondary | ICD-10-CM | POA: Insufficient documentation

## 2020-07-10 DIAGNOSIS — Z8052 Family history of malignant neoplasm of bladder: Secondary | ICD-10-CM | POA: Insufficient documentation

## 2020-07-10 DIAGNOSIS — D494 Neoplasm of unspecified behavior of bladder: Secondary | ICD-10-CM | POA: Diagnosis present

## 2020-07-10 HISTORY — PX: TRANSURETHRAL RESECTION OF BLADDER TUMOR WITH MITOMYCIN-C: SHX6459

## 2020-07-10 SURGERY — TRANSURETHRAL RESECTION OF BLADDER TUMOR WITH MITOMYCIN-C
Anesthesia: General

## 2020-07-10 MED ORDER — CEFAZOLIN SODIUM-DEXTROSE 2-4 GM/100ML-% IV SOLN
2.0000 g | INTRAVENOUS | Status: AC
  Administered 2020-07-10: 2 g via INTRAVENOUS

## 2020-07-10 MED ORDER — MIDAZOLAM HCL 2 MG/2ML IJ SOLN: 0.5000 mg | Freq: Once | INTRAMUSCULAR | Status: DC | PRN

## 2020-07-10 MED ORDER — FENTANYL CITRATE (PF) 100 MCG/2ML IJ SOLN
INTRAMUSCULAR | Status: AC
  Filled 2020-07-10: qty 2

## 2020-07-10 MED ORDER — FENTANYL CITRATE (PF) 100 MCG/2ML IJ SOLN: 25.0000 ug | INTRAMUSCULAR | Status: DC | PRN

## 2020-07-10 MED ORDER — SODIUM CHLORIDE 0.9 % IR SOLN
Status: DC | PRN
  Administered 2020-07-10: 3000 mL via INTRAVESICAL

## 2020-07-10 MED ORDER — MIDAZOLAM HCL 2 MG/2ML IJ SOLN
INTRAMUSCULAR | Status: AC
  Filled 2020-07-10: qty 2

## 2020-07-10 MED ORDER — BELLADONNA ALKALOIDS-OPIUM 16.2-60 MG RE SUPP
RECTAL | Status: AC
  Filled 2020-07-10: qty 1

## 2020-07-10 MED ORDER — ONDANSETRON HCL 4 MG/2ML IJ SOLN
INTRAMUSCULAR | Status: DC | PRN
  Administered 2020-07-10: 4 mg via INTRAVENOUS

## 2020-07-10 MED ORDER — BELLADONNA ALKALOIDS-OPIUM 16.2-60 MG RE SUPP
RECTAL | Status: DC | PRN
  Administered 2020-07-10: 1 via RECTAL

## 2020-07-10 MED ORDER — DEXAMETHASONE SODIUM PHOSPHATE 10 MG/ML IJ SOLN
INTRAMUSCULAR | Status: AC
  Filled 2020-07-10: qty 1

## 2020-07-10 MED ORDER — LACTATED RINGERS IV SOLN: INTRAVENOUS | Status: DC

## 2020-07-10 MED ORDER — HYDROCODONE-ACETAMINOPHEN 5-325 MG PO TABS: 1.0000 | ORAL_TABLET | ORAL | 0 refills | Status: DC | PRN

## 2020-07-10 MED ORDER — ONDANSETRON HCL 4 MG/2ML IJ SOLN
INTRAMUSCULAR | Status: AC
  Filled 2020-07-10: qty 2

## 2020-07-10 MED ORDER — CEFAZOLIN SODIUM-DEXTROSE 2-4 GM/100ML-% IV SOLN
INTRAVENOUS | Status: AC
  Filled 2020-07-10: qty 100

## 2020-07-10 MED ORDER — LIDOCAINE 2% (20 MG/ML) 5 ML SYRINGE
INTRAMUSCULAR | Status: AC
  Filled 2020-07-10: qty 5

## 2020-07-10 MED ORDER — PROPOFOL 10 MG/ML IV BOLUS
INTRAVENOUS | Status: DC | PRN
  Administered 2020-07-10: 200 mg via INTRAVENOUS

## 2020-07-10 MED ORDER — LIDOCAINE 2% (20 MG/ML) 5 ML SYRINGE
INTRAMUSCULAR | Status: DC | PRN
  Administered 2020-07-10: 100 mg via INTRAVENOUS

## 2020-07-10 MED ORDER — MEPERIDINE HCL 25 MG/ML IJ SOLN: 6.2500 mg | INTRAMUSCULAR | Status: DC | PRN

## 2020-07-10 MED ORDER — FENTANYL CITRATE (PF) 100 MCG/2ML IJ SOLN
INTRAMUSCULAR | Status: DC | PRN
  Administered 2020-07-10: 50 ug via INTRAVENOUS
  Administered 2020-07-10: 25 ug via INTRAVENOUS

## 2020-07-10 MED ORDER — PROMETHAZINE HCL 25 MG/ML IJ SOLN: 6.2500 mg | INTRAMUSCULAR | Status: DC | PRN

## 2020-07-10 MED ORDER — EPHEDRINE 5 MG/ML INJ
INTRAVENOUS | Status: AC
  Filled 2020-07-10: qty 10

## 2020-07-10 MED ORDER — PROPOFOL 10 MG/ML IV BOLUS
INTRAVENOUS | Status: AC
  Filled 2020-07-10: qty 40

## 2020-07-10 MED ORDER — GEMCITABINE CHEMO FOR BLADDER INSTILLATION 2000 MG
2000.0000 mg | Freq: Once | INTRAVENOUS | Status: AC
  Administered 2020-07-10: 2000 mg via INTRAVESICAL
  Filled 2020-07-10: qty 2000

## 2020-07-10 MED ORDER — MIDAZOLAM HCL 5 MG/5ML IJ SOLN
INTRAMUSCULAR | Status: DC | PRN
  Administered 2020-07-10: 2 mg via INTRAVENOUS

## 2020-07-10 MED ORDER — DEXAMETHASONE SODIUM PHOSPHATE 10 MG/ML IJ SOLN
INTRAMUSCULAR | Status: DC | PRN
  Administered 2020-07-10: 10 mg via INTRAVENOUS

## 2020-07-10 MED ORDER — EPHEDRINE SULFATE-NACL 50-0.9 MG/10ML-% IV SOSY
PREFILLED_SYRINGE | INTRAVENOUS | Status: DC | PRN
  Administered 2020-07-10 (×2): 10 mg via INTRAVENOUS

## 2020-07-10 SURGICAL SUPPLY — 23 items
BAG DRAIN URO-CYSTO SKYTR STRL (DRAIN) ×3 IMPLANT
BAG DRN RND TRDRP ANRFLXCHMBR (UROLOGICAL SUPPLIES) ×1
BAG DRN UROCATH (DRAIN) ×1
BAG URINE DRAIN 2000ML AR STRL (UROLOGICAL SUPPLIES) ×3 IMPLANT
BAG URINE LEG 500ML (DRAIN) IMPLANT
CATH FOLEY 2WAY SLVR  5CC 22FR (CATHETERS)
CATH FOLEY 2WAY SLVR 30CC 20FR (CATHETERS) ×3 IMPLANT
CATH FOLEY 2WAY SLVR 5CC 22FR (CATHETERS) IMPLANT
CLOTH BEACON ORANGE TIMEOUT ST (SAFETY) ×3 IMPLANT
ELECT REM PT RETURN 9FT ADLT (ELECTROSURGICAL)
ELECTRODE REM PT RTRN 9FT ADLT (ELECTROSURGICAL) IMPLANT
EVACUATOR MICROVAS BLADDER (UROLOGICAL SUPPLIES) IMPLANT
GLOVE BIO SURGEON STRL SZ7.5 (GLOVE) ×3 IMPLANT
GOWN STRL REUS W/ TWL LRG LVL3 (GOWN DISPOSABLE) ×1 IMPLANT
GOWN STRL REUS W/TWL LRG LVL3 (GOWN DISPOSABLE) ×3
IV NS IRRIG 3000ML ARTHROMATIC (IV SOLUTION) ×3 IMPLANT
KIT TURNOVER CYSTO (KITS) ×3 IMPLANT
MANIFOLD NEPTUNE II (INSTRUMENTS) ×3 IMPLANT
PACK CYSTO (CUSTOM PROCEDURE TRAY) ×3 IMPLANT
SYR TOOMEY IRRIG 70ML (MISCELLANEOUS)
SYRINGE TOOMEY IRRIG 70ML (MISCELLANEOUS) IMPLANT
TUBE CONNECTING 12'X1/4 (SUCTIONS) ×1
TUBE CONNECTING 12X1/4 (SUCTIONS) ×2 IMPLANT

## 2020-07-10 NOTE — Anesthesia Procedure Notes (Signed)
Procedure Name: LMA Insertion Date/Time: 07/10/2020 8:58 AM Performed by: Bonney Aid, CRNA Pre-anesthesia Checklist: Patient identified, Emergency Drugs available, Suction available and Patient being monitored Patient Re-evaluated:Patient Re-evaluated prior to induction Oxygen Delivery Method: Circle system utilized Preoxygenation: Pre-oxygenation with 100% oxygen Induction Type: IV induction Ventilation: Mask ventilation without difficulty LMA: LMA inserted LMA Size: 5.0 Number of attempts: 1 Airway Equipment and Method: Bite block Placement Confirmation: positive ETCO2 Tube secured with: Tape Dental Injury: Teeth and Oropharynx as per pre-operative assessment

## 2020-07-10 NOTE — Discharge Instructions (Addendum)
Transurethral Resection of Bladder Tumor (TURBT) or Bladder Biopsy   Definition:  Transurethral Resection of the Bladder Tumor is a surgical procedure used to diagnose and remove tumors within the bladder. TURBT is the most common treatment for early stage bladder cancer.  General instructions:     Your recent bladder surgery requires very little post hospital care but some definite precautions.  Despite the fact that no skin incisions were used, the area around the bladder incisions are raw and covered with scabs to promote healing and prevent bleeding. Certain precautions are needed to insure that the scabs are not disturbed over the next 2-4 weeks while the healing proceeds.  Because the raw surface inside your bladder and the irritating effects of urine you may expect frequency of urination and/or urgency (a stronger desire to urinate) and perhaps even getting up at night more often. This will usually resolve or improve slowly over the healing period. You may see some blood in your urine over the first 6 weeks. Do not be alarmed, even if the urine was clear for a while. Get off your feet and drink lots of fluids until clearing occurs. If you start to pass clots or don't improve call us.  Diet:  You may return to your normal diet immediately. Because of the raw surface of your bladder, alcohol, spicy foods, foods high in acid and drinks with caffeine may cause irritation or frequency and should be used in moderation. To keep your urine flowing freely and avoid constipation, drink plenty of fluids during the day (8-10 glasses). Tip: Avoid cranberry juice because it is very acidic.  Activity:  Your physical activity doesn't need to be restricted. However, if you are very active, you may see some blood in the urine. We suggest that you reduce your activity under the circumstances until the bleeding has stopped.  Bowels:  It is important to keep your bowels regular during the postoperative  period. Straining with bowel movements can cause bleeding. A bowel movement every other day is reasonable. Use a mild laxative if needed, such as milk of magnesia 2-3 tablespoons, or 2 Dulcolax tablets. Call if you continue to have problems. If you had been taking narcotics for pain, before, during or after your surgery, you may be constipated. Take a laxative if necessary.    Medication:  You should resume your pre-surgery medications unless told not to. In addition you may be given an antibiotic to prevent or treat infection. Antibiotics are not always necessary. All medication should be taken as prescribed until the bottles are finished unless you are having an unusual reaction to one of the drugs.   Post Anesthesia Home Care Instructions  Activity: Get plenty of rest for the remainder of the day. A responsible individual must stay with you for 24 hours following the procedure.  For the next 24 hours, DO NOT: -Drive a car -Operate machinery -Drink alcoholic beverages -Take any medication unless instructed by your physician -Make any legal decisions or sign important papers.  Meals: Start with liquid foods such as gelatin or soup. Progress to regular foods as tolerated. Avoid greasy, spicy, heavy foods. If nausea and/or vomiting occur, drink only clear liquids until the nausea and/or vomiting subsides. Call your physician if vomiting continues.  Special Instructions/Symptoms: Your throat may feel dry or sore from the anesthesia or the breathing tube placed in your throat during surgery. If this causes discomfort, gargle with warm salt water. The discomfort should disappear within 24 hours.       

## 2020-07-10 NOTE — Transfer of Care (Signed)
Immediate Anesthesia Transfer of Care Note  Patient: Raymond Wagner  Procedure(s) Performed: TRANSURETHRAL RESECTION OF BLADDER TUMOR WITH GEMCITABINE (N/A )  Patient Location: PACU  Anesthesia Type:General  Level of Consciousness: drowsy  Airway & Oxygen Therapy: Patient Spontanous Breathing and Patient connected to nasal cannula oxygen  Post-op Assessment: Report given to RN  Post vital signs: Reviewed and stable  Last Vitals:  Vitals Value Taken Time  BP 120/78   Temp 36.3 C 07/10/20 0947  Pulse 75 07/10/20 0948  Resp 12 07/10/20 0948  SpO2 100 % 07/10/20 0948  Vitals shown include unvalidated device data.  Last Pain:  Vitals:   07/10/20 0651  TempSrc: Oral  PainSc: 0-No pain         Complications: No complications documented.

## 2020-07-10 NOTE — Op Note (Signed)
Operative Note  Preoperative diagnosis:  1.  Bladder tumor  Postoperative diagnosis: 1.  Bladder tumor--medium  Procedure(s): 1.  Transurethral resection of bladder tumor--medium 2.  Intravesical instillation of gemcitabine  Surgeon: Link Snuffer, MD  Assistants: None  Anesthesia: General  Complications: None immediate  EBL: Minimal  Specimens: 1.  Bladder tumor  Drains/Catheters: 1.  20 French Foley catheter  Intraoperative findings: 1.  Normal urethra 2.  Bilateral ureteral orifices were normal. 3.  Multiple what appeared to be superficial tumors on the right posterior wall just lateral to the right ureteral orifice.  All tumor was completely resected superficially.  Tumor encroached very close upon the right ureteral orifice but the ureteral orifice was spared from any cautery or cut.  Indication: 64 year old male underwent hematuria work-up and was found to have a bladder tumor.  He presents for the previously mentioned operation.  Description of procedure:  The patient was identified and consent was obtained.  The patient was taken to the operating room and placed in the supine position.  The patient was placed under general anesthesia.  Perioperative antibiotics were administered.  The patient was placed in dorsal lithotomy.  Patient was prepped and draped in a standard sterile fashion and a timeout was performed.  A 26 French resectoscope with a visual obturator in place was advanced into the urethra and into the bladder.  Complete cystoscopy was performed with findings noted above.  I exchanged for the working element.  On bipolar settings, I resected the bladder tumor of interest.  All of this was resected superficially.  No other tumors were seen.  I evacuated all bladder tumor specimen and collected it for permanent pathology.  I reinspected the bladder mucosa and there was no active bleeding.  No further tumors.  I therefore withdrew the scope and placed a 20 French  Foley catheter.  Patient tolerated procedure well and was stable postoperative.  In the PACU, I instilled intravesical gemcitabine where it remained for approximately 1 hour prior to proper disposal.  Plan: Return in 1 week for pathology review

## 2020-07-10 NOTE — Anesthesia Preprocedure Evaluation (Addendum)
Anesthesia Evaluation  Patient identified by MRN, date of birth, ID band Patient awake    Reviewed: Allergy & Precautions, NPO status , Patient's Chart, lab work & pertinent test results  History of Anesthesia Complications Negative for: history of anesthetic complications  Airway Mallampati: II  TM Distance: >3 FB Neck ROM: Full    Dental  (+) Dental Advisory Given   Pulmonary neg pulmonary ROS,  07/08/2020 SARS coronavirus NEG   breath sounds clear to auscultation       Cardiovascular negative cardio ROS   Rhythm:Regular Rate:Normal     Neuro/Psych negative neurological ROS     GI/Hepatic negative GI ROS, Neg liver ROS,   Endo/Other  negative endocrine ROS  Renal/GU negative Renal ROS   Bladder cancer    Musculoskeletal   Abdominal   Peds  Hematology negative hematology ROS (+)   Anesthesia Other Findings H/o melanoma  Reproductive/Obstetrics                            Anesthesia Physical Anesthesia Plan  ASA: II  Anesthesia Plan: General   Post-op Pain Management:    Induction: Intravenous  PONV Risk Score and Plan: 2 and Ondansetron and Dexamethasone  Airway Management Planned: LMA  Additional Equipment: None  Intra-op Plan:   Post-operative Plan:   Informed Consent: I have reviewed the patients History and Physical, chart, labs and discussed the procedure including the risks, benefits and alternatives for the proposed anesthesia with the patient or authorized representative who has indicated his/her understanding and acceptance.     Dental advisory given  Plan Discussed with: CRNA and Surgeon  Anesthesia Plan Comments:        Anesthesia Quick Evaluation

## 2020-07-10 NOTE — H&P (Signed)
H&P  Chief Complaint: Bladder tumors  History of Present Illness: 64 year old male evaluated for gross hematuria by Dr. Matilde Sprang.  He performed a cystoscopy that revealed multiple bladder tumors on the posterior floor the bladder.  He presents for cystoscopy with transurethral resection of bladder tumor with instillation of gemcitabine.  Past Medical History:  Diagnosis Date  . Bladder tumor   . Familial tremor   . History of melanoma excision    SCALP 2016  . 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 colonscopy  age 56  . HAND SURGERY Left 2017   cyst removed under left index finger nail  . INGUINAL HERNIA REPAIR Left 04/17/2016   Procedure: OPEN LEFT [place]nard;  Surgeon: Mickeal Skinner, MD;  Location: [facility];  Service: General;  Laterality: Left;  . INSERTION OF MESH Left 04/17/2016   Procedure: INSERTION OF MESH;  Surgeon: Mickeal Skinner, MD;  Location: [facility];  Service: General;  Laterality: Left;  Marland Kitchen MELANOMA EXCISION  2016  and 2007   scalp area  . right inguinal hernia repair  2007   in [facility] Medications:  Medications Prior to Admission  Medication Sig Dispense Refill Last Dose  . aspirin EC 81 MG tablet Take 81 mg by mouth daily.   Past Month at Unknown time  . cetirizine (ZYRTEC) 10 MG tablet Take 10 mg by mouth daily as needed.    Past Month at Unknown time  . ibuprofen (ADVIL) 200 MG tablet Take 400 mg by mouth every 6 (six) hours as needed (For body aches.).   Past Month at Unknown time  . ibuprofen (ADVIL,MOTRIN) 800 MG tablet Take 1 tablet (800 mg total) by mouth every 8 (eight) hours as needed. 50 tablet 0 Past Month at Unknown time  . Multiple Vitamin (MULTIVITAMIN WITH MINERALS) TABS tablet Take 1 tablet by mouth daily.   Past Month at Unknown time   Allergies: No Known Allergies  Family History  Problem Relation Age of Onset  . Hyperlipidemia  Mother   . Hypertension Mother   . Bladder Cancer Father   . Parkinson's disease Father   . Diabetes Sister   . Heart attack Maternal Grandfather    Social History:  reports that he has never smoked. He has never used smokeless tobacco. He reports that he does not drink alcohol and does not use drugs.  ROS: A complete review of systems was performed.  All systems are negative except for pertinent findings as noted. ROS   Physical Exam:  Vital signs in last 24 hours: Temp:  [98.2 F (36.8 C)] 98.2 F (36.8 C) (08/16 0651) Pulse Rate:  [61] 61 (08/16 0651) Resp:  [16] 16 (08/16 0651) BP: (153)/(83) 153/83 (08/16 0651) SpO2:  [99 %] 99 % (08/16 0651) Weight:  [78.3 kg] 78.3 kg (08/16 0651) General:  Alert and oriented, No acute distress HEENT: Normocephalic, atraumatic Neck: No JVD or lymphadenopathy Cardiovascular: Regular rate and rhythm Lungs: Regular rate and effort Abdomen: Soft, nontender, nondistended, no abdominal masses Back: No CVA tenderness Extremities: No edema Neurologic: Grossly intact  Laboratory Data:  No results found for this or any previous visit (from the past 24 hour(s)). Recent Results (from the past 240 hour(s))  SARS CORONAVIRUS 2 (TAT 6-24 HRS) Nasopharyngeal Nasopharyngeal Swab     Status: None   Collection Time: 07/08/20  2:00 PM   Specimen: Nasopharyngeal Swab  Result Value Ref Range Status   SARS Coronavirus 2 NEGATIVE NEGATIVE Final    Comment: (NOTE) SARS-CoV-2 target nucleic acids are NOT DETECTED.  The SARS-CoV-2 RNA is generally detectable in upper and lower respiratory specimens during the acute phase of infection. Negative results do not preclude SARS-CoV-2 infection, do not rule out co-infections with other pathogens, and should not be used as the sole basis for treatment or other patient management decisions. Negative results must be combined with clinical observations, patient history, and epidemiological information. The  expected result is Negative.  Fact Sheet for Patients: SugarRoll.be  Fact Sheet for Healthcare Providers: https://www.woods-mathews.com/  This test is not yet approved or cleared by the [place] FDA and  has been authorized for detection and/or diagnosis of SARS-CoV-2 by FDA under an Emergency Use Authorization (EUA). This EUA will remain  in effect (meaning this test can be used) for the duration of the COVID-19 declaration under Se ction 564(b)(1) of the Act, 21 U.S.C. section 360bbb-3(b)(1), unless the authorization is terminated or revoked sooner.  Performed at [facility], [place] [address] [place], [place] [address]    Creatinine: No results for input(s): CREATININE in the last 168 hours.  Impression/Assessment:  Bladder tumor  Plan:  Proceed with TURBT with instillation of gemcitabine.  Risk and benefits discussed.  Marton Redwood, III 07/10/2020, 8:38 AM

## 2020-07-10 NOTE — Anesthesia Postprocedure Evaluation (Signed)
Anesthesia Post Note  Patient: Raymond Wagner  Procedure(s) Performed: TRANSURETHRAL RESECTION OF BLADDER TUMOR WITH GEMCITABINE (N/A )     Patient location during evaluation: PACU Anesthesia Type: General Level of consciousness: patient cooperative, sedated and oriented Pain management: pain level controlled Vital Signs Assessment: post-procedure vital signs reviewed and stable Respiratory status: spontaneous breathing, nonlabored ventilation and respiratory function stable Cardiovascular status: blood pressure returned to baseline and stable Postop Assessment: no apparent nausea or vomiting Anesthetic complications: no   No complications documented.  Last Vitals:  Vitals:   07/10/20 1030 07/10/20 1045  BP: 127/74 119/70  Pulse: 70 69  Resp: 16 15  Temp:    SpO2: 97% 97%    Last Pain:  Vitals:   07/10/20 1045  TempSrc:   PainSc: 0-No pain                 Evony Rezek,E. Krystall Kruckenberg

## 2020-07-11 LAB — SURGICAL PATHOLOGY

## 2020-07-12 ENCOUNTER — Encounter (HOSPITAL_BASED_OUTPATIENT_CLINIC_OR_DEPARTMENT_OTHER): Payer: Self-pay | Admitting: Urology

## 2020-10-24 ENCOUNTER — Other Ambulatory Visit: Payer: Self-pay | Admitting: Urology

## 2020-10-25 NOTE — Addendum Note (Signed)
Addended by: Janith Lima on: 10/25/2020 01:34 PM   Modules accepted: Orders

## 2020-11-23 ENCOUNTER — Other Ambulatory Visit: Payer: Self-pay

## 2020-11-23 ENCOUNTER — Encounter (HOSPITAL_BASED_OUTPATIENT_CLINIC_OR_DEPARTMENT_OTHER): Payer: Self-pay | Admitting: Urology

## 2020-11-23 ENCOUNTER — Other Ambulatory Visit (HOSPITAL_COMMUNITY)
Admission: RE | Admit: 2020-11-23 | Discharge: 2020-11-23 | Disposition: A | Discharge status: Home / Self Care | Payer: BC Managed Care – PPO | Source: Ambulatory Visit | Attending: Urology | Admitting: Urology

## 2020-11-23 DIAGNOSIS — Z01812 Encounter for preprocedural laboratory examination: Secondary | ICD-10-CM | POA: Insufficient documentation

## 2020-11-23 DIAGNOSIS — Z20822 Contact with and (suspected) exposure to covid-19: Secondary | ICD-10-CM | POA: Insufficient documentation

## 2020-11-23 LAB — SARS CORONAVIRUS 2 (TAT 6-24 HRS): SARS Coronavirus 2: NEGATIVE

## 2020-11-23 NOTE — Progress Notes (Signed)
Spoke w/ via phone for pre-op interview--- PT Lab needs dos----  no             Lab results------ no COVID test ------ done 11-23-2020  Result in epic Arrive at ------- 1130 NPO after MN NO Solid Food.  Clear liquids from MN until--- 1030 Medications to take morning of surgery ----- NONE Diabetic medication ----- n/a Patient Special Instructions ----- n/a Pre-Op special Istructions ----- n/a Patient verbalized understanding of instructions that were given at this phone interview. Patient denies shortness of breath, chest pain, fever, cough at this phone interview.

## 2020-11-27 ENCOUNTER — Ambulatory Visit (HOSPITAL_BASED_OUTPATIENT_CLINIC_OR_DEPARTMENT_OTHER): Payer: BC Managed Care – PPO | Admitting: Anesthesiology

## 2020-11-27 ENCOUNTER — Encounter (HOSPITAL_BASED_OUTPATIENT_CLINIC_OR_DEPARTMENT_OTHER): Payer: Self-pay | Admitting: Urology

## 2020-11-27 ENCOUNTER — Ambulatory Visit (HOSPITAL_BASED_OUTPATIENT_CLINIC_OR_DEPARTMENT_OTHER)
Admission: RE | Admit: 2020-11-27 | Discharge: 2020-11-27 | Disposition: A | Discharge status: Home / Self Care | Payer: BC Managed Care – PPO | Attending: Urology | Admitting: Urology

## 2020-11-27 ENCOUNTER — Encounter (HOSPITAL_BASED_OUTPATIENT_CLINIC_OR_DEPARTMENT_OTHER)
Admission: RE | Disposition: A | Discharge status: Home / Self Care | Payer: Self-pay | Source: Home / Self Care | Attending: Urology

## 2020-11-27 ENCOUNTER — Other Ambulatory Visit: Payer: Self-pay

## 2020-11-27 DIAGNOSIS — N3289 Other specified disorders of bladder: Secondary | ICD-10-CM | POA: Insufficient documentation

## 2020-11-27 DIAGNOSIS — N3020 Other chronic cystitis without hematuria: Secondary | ICD-10-CM | POA: Insufficient documentation

## 2020-11-27 DIAGNOSIS — Z8551 Personal history of malignant neoplasm of bladder: Secondary | ICD-10-CM | POA: Insufficient documentation

## 2020-11-27 DIAGNOSIS — Z79899 Other long term (current) drug therapy: Secondary | ICD-10-CM | POA: Insufficient documentation

## 2020-11-27 DIAGNOSIS — C678 Malignant neoplasm of overlapping sites of bladder: Secondary | ICD-10-CM | POA: Insufficient documentation

## 2020-11-27 DIAGNOSIS — Z85820 Personal history of malignant melanoma of skin: Secondary | ICD-10-CM | POA: Diagnosis not present

## 2020-11-27 DIAGNOSIS — Z7982 Long term (current) use of aspirin: Secondary | ICD-10-CM | POA: Insufficient documentation

## 2020-11-27 HISTORY — PX: TRANSURETHRAL RESECTION OF BLADDER TUMOR: SHX2575

## 2020-11-27 HISTORY — DX: Other specified disorders of bladder: N32.89

## 2020-11-27 HISTORY — DX: Presence of spectacles and contact lenses: Z97.3

## 2020-11-27 SURGERY — TURBT (TRANSURETHRAL RESECTION OF BLADDER TUMOR)
Anesthesia: General

## 2020-11-27 MED ORDER — KETOROLAC TROMETHAMINE 30 MG/ML IJ SOLN: 30.0000 mg | Freq: Once | INTRAMUSCULAR | Status: DC | PRN

## 2020-11-27 MED ORDER — MIDAZOLAM HCL 5 MG/5ML IJ SOLN
INTRAMUSCULAR | Status: DC | PRN
  Administered 2020-11-27: 2 mg via INTRAVENOUS

## 2020-11-27 MED ORDER — PHENYLEPHRINE 40 MCG/ML (10ML) SYRINGE FOR IV PUSH (FOR BLOOD PRESSURE SUPPORT)
PREFILLED_SYRINGE | INTRAVENOUS | Status: DC | PRN
  Administered 2020-11-27 (×2): 120 ug via INTRAVENOUS

## 2020-11-27 MED ORDER — PROPOFOL 10 MG/ML IV BOLUS
INTRAVENOUS | Status: DC | PRN
  Administered 2020-11-27: 200 mg via INTRAVENOUS

## 2020-11-27 MED ORDER — DEXAMETHASONE SODIUM PHOSPHATE 10 MG/ML IJ SOLN
INTRAMUSCULAR | Status: DC | PRN
  Administered 2020-11-27: 10 mg via INTRAVENOUS

## 2020-11-27 MED ORDER — GEMCITABINE CHEMO FOR BLADDER INSTILLATION 2000 MG
2000.0000 mg | Freq: Once | INTRAVENOUS | Status: AC
  Administered 2020-11-27: 2000 mg via INTRAVESICAL
  Filled 2020-11-27: qty 2000

## 2020-11-27 MED ORDER — AMISULPRIDE (ANTIEMETIC) 5 MG/2ML IV SOLN: 10.0000 mg | Freq: Once | INTRAVENOUS | Status: DC | PRN

## 2020-11-27 MED ORDER — DOCUSATE SODIUM 100 MG PO CAPS: 100.0000 mg | ORAL_CAPSULE | Freq: Every day | ORAL | 0 refills | Status: AC | PRN

## 2020-11-27 MED ORDER — PROPOFOL 10 MG/ML IV BOLUS
INTRAVENOUS | Status: AC
  Filled 2020-11-27: qty 20

## 2020-11-27 MED ORDER — FENTANYL CITRATE (PF) 100 MCG/2ML IJ SOLN
INTRAMUSCULAR | Status: AC
  Filled 2020-11-27: qty 2

## 2020-11-27 MED ORDER — ONDANSETRON HCL 4 MG/2ML IJ SOLN
INTRAMUSCULAR | Status: AC
  Filled 2020-11-27: qty 2

## 2020-11-27 MED ORDER — OXYCODONE HCL 5 MG PO TABS: 5.0000 mg | ORAL_TABLET | Freq: Once | ORAL | Status: DC | PRN

## 2020-11-27 MED ORDER — FENTANYL CITRATE (PF) 100 MCG/2ML IJ SOLN
INTRAMUSCULAR | Status: DC | PRN
  Administered 2020-11-27: 50 ug via INTRAVENOUS

## 2020-11-27 MED ORDER — LIDOCAINE 2% (20 MG/ML) 5 ML SYRINGE
INTRAMUSCULAR | Status: DC | PRN
  Administered 2020-11-27: 100 mg via INTRAVENOUS

## 2020-11-27 MED ORDER — FENTANYL CITRATE (PF) 100 MCG/2ML IJ SOLN
25.0000 ug | INTRAMUSCULAR | Status: DC | PRN
  Administered 2020-11-27: 50 ug via INTRAVENOUS

## 2020-11-27 MED ORDER — ONDANSETRON HCL 4 MG/2ML IJ SOLN
INTRAMUSCULAR | Status: DC | PRN
  Administered 2020-11-27: 4 mg via INTRAVENOUS

## 2020-11-27 MED ORDER — LIDOCAINE HCL (PF) 2 % IJ SOLN
INTRAMUSCULAR | Status: AC
  Filled 2020-11-27: qty 5

## 2020-11-27 MED ORDER — EPHEDRINE SULFATE-NACL 50-0.9 MG/10ML-% IV SOSY
PREFILLED_SYRINGE | INTRAVENOUS | Status: DC | PRN
  Administered 2020-11-27: 10 mg via INTRAVENOUS
  Administered 2020-11-27: 15 mg via INTRAVENOUS

## 2020-11-27 MED ORDER — MIDAZOLAM HCL 2 MG/2ML IJ SOLN
INTRAMUSCULAR | Status: AC
  Filled 2020-11-27: qty 2

## 2020-11-27 MED ORDER — DEXAMETHASONE SODIUM PHOSPHATE 10 MG/ML IJ SOLN
INTRAMUSCULAR | Status: AC
  Filled 2020-11-27: qty 1

## 2020-11-27 MED ORDER — CEFAZOLIN SODIUM-DEXTROSE 2-4 GM/100ML-% IV SOLN
2.0000 g | Freq: Once | INTRAVENOUS | Status: AC
  Administered 2020-11-27: 2 g via INTRAVENOUS

## 2020-11-27 MED ORDER — OXYCODONE-ACETAMINOPHEN 5-325 MG PO TABS: 1.0000 | ORAL_TABLET | ORAL | 0 refills | Status: DC | PRN

## 2020-11-27 MED ORDER — SODIUM CHLORIDE 0.9 % IR SOLN
Status: DC | PRN
  Administered 2020-11-27: 3000 mL via INTRAVESICAL

## 2020-11-27 MED ORDER — MEPERIDINE HCL 25 MG/ML IJ SOLN: 6.2500 mg | INTRAMUSCULAR | Status: DC | PRN

## 2020-11-27 MED ORDER — LACTATED RINGERS IV SOLN
INTRAVENOUS | Status: DC
  Administered 2020-11-27: 1000 mL via INTRAVENOUS

## 2020-11-27 MED ORDER — CEFAZOLIN SODIUM-DEXTROSE 2-4 GM/100ML-% IV SOLN
INTRAVENOUS | Status: AC
  Filled 2020-11-27: qty 100

## 2020-11-27 MED ORDER — OXYCODONE HCL 5 MG/5ML PO SOLN: 5.0000 mg | Freq: Once | ORAL | Status: DC | PRN

## 2020-11-27 SURGICAL SUPPLY — 26 items
BAG DRAIN URO-CYSTO SKYTR STRL (DRAIN) ×2 IMPLANT
BAG DRN RND TRDRP ANRFLXCHMBR (UROLOGICAL SUPPLIES)
BAG DRN UROCATH (DRAIN) ×1
BAG URINE DRAIN 2000ML AR STRL (UROLOGICAL SUPPLIES) IMPLANT
BAG URINE LEG 500ML (DRAIN) IMPLANT
CATH FOLEY 2WAY SLVR  5CC 22FR (CATHETERS)
CATH FOLEY 2WAY SLVR 30CC 20FR (CATHETERS) IMPLANT
CATH FOLEY 2WAY SLVR 5CC 22FR (CATHETERS) IMPLANT
CATH SET URETHRAL DILATOR (CATHETERS) IMPLANT
CATH URET 5FR 28IN OPEN ENDED (CATHETERS) IMPLANT
CLOTH BEACON ORANGE TIMEOUT ST (SAFETY) ×2 IMPLANT
ELECT REM PT RETURN 15FT ADLT (MISCELLANEOUS) ×2 IMPLANT
EVACUATOR MICROVAS BLADDER (UROLOGICAL SUPPLIES) IMPLANT
GLOVE BIO SURGEON STRL SZ7 (GLOVE) IMPLANT
GOWN STRL REUS W/ TWL LRG LVL3 (GOWN DISPOSABLE) ×1 IMPLANT
GOWN STRL REUS W/TWL LRG LVL3 (GOWN DISPOSABLE) ×2
GUIDEWIRE ZIPWRE .038 STRAIGHT (WIRE) IMPLANT
IV NS IRRIG 3000ML ARTHROMATIC (IV SOLUTION) ×2 IMPLANT
KIT TURNOVER CYSTO (KITS) ×2 IMPLANT
LOOP CUT BIPOLAR 24F LRG (ELECTROSURGICAL) IMPLANT
MANIFOLD NEPTUNE II (INSTRUMENTS) ×2 IMPLANT
PACK CYSTO (CUSTOM PROCEDURE TRAY) ×2 IMPLANT
SYR 10ML LL (SYRINGE) ×2 IMPLANT
SYR TOOMEY IRRIG 70ML (MISCELLANEOUS) ×2
SYRINGE TOOMEY IRRIG 70ML (MISCELLANEOUS) ×1 IMPLANT
TUBE CONNECTING 12X1/4 (SUCTIONS) IMPLANT

## 2020-11-27 NOTE — Anesthesia Preprocedure Evaluation (Signed)
Anesthesia Evaluation  Patient identified by MRN, date of birth, ID band Patient awake    Reviewed: Allergy & Precautions, H&P , NPO status , Patient's Chart, lab work & pertinent test results  Airway Mallampati: II  TM Distance: >3 FB Neck ROM: Full    Dental no notable dental hx.    Pulmonary neg pulmonary ROS,    Pulmonary exam normal breath sounds clear to auscultation       Cardiovascular negative cardio ROS Normal cardiovascular exam Rhythm:Regular Rate:Normal     Neuro/Psych negative neurological ROS  negative psych ROS   GI/Hepatic negative GI ROS, Neg liver ROS,   Endo/Other  negative endocrine ROS  Renal/GU negative Renal ROS  negative genitourinary   Musculoskeletal negative musculoskeletal ROS (+)   Abdominal   Peds negative pediatric ROS (+)  Hematology negative hematology ROS (+)   Anesthesia Other Findings   Reproductive/Obstetrics negative OB ROS                             Anesthesia Physical Anesthesia Plan  ASA: II  Anesthesia Plan: General   Post-op Pain Management:    Induction: Intravenous  PONV Risk Score and Plan: 2 and Ondansetron, Dexamethasone and Treatment may vary due to age or medical condition  Airway Management Planned: LMA  Additional Equipment:   Intra-op Plan:   Post-operative Plan: Extubation in OR  Informed Consent: I have reviewed the patients History and Physical, chart, labs and discussed the procedure including the risks, benefits and alternatives for the proposed anesthesia with the patient or authorized representative who has indicated his/her understanding and acceptance.     Dental advisory given  Plan Discussed with: CRNA and Surgeon  Anesthesia Plan Comments:         Anesthesia Quick Evaluation  

## 2020-11-27 NOTE — Progress Notes (Signed)
No new drainage from surgical site.

## 2020-11-27 NOTE — Op Note (Signed)
Operative Note  Preoperative diagnosis:  1.  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  TURBT small 2. Instillation of gemcitabine  Surgeon: Rexene Alberts, MD  Assistants:  None  Anesthesia:  General  Complications:  None  EBL:  minimal  Specimens: 1.  ID Type Source Tests Collected by Time Destination  1 : BLADDER TUMOR Tissue PATH GU tumor resection SURGICAL PATHOLOGY Janith Lima, MD 11/27/2020 1356   2 : BLADDER NECK BLADDER TUMOR Tissue PATH GU tumor resection SURGICAL PATHOLOGY Janith Lima, MD 11/27/2020 1407     Drains/Catheters: 1.  18 Fr foley  Intraoperative findings:   1. Subcentimeter papillary lesion on the prior resection site at the right posterior bladder wall.  Cystoscopy resected with excellent hemostasis. (Labeled the specimen as bladder tumor.) 2.  Bladder neck bladder tumor at the 3 o'clock position of the bladder neck.  Successfully resected with excellent hemostasis. (Labeled the specimen as bladder neck bladder tumor.)  Number of tumors:         2  Size of largest tumor:         1 cm    Characteristics of tumors:     Papillary  yes  Recurrent   yes  Suspicious for Carcinoma in situ:    no  Clinical tumor stage:      cTa     Bimanual exam under anesthesia:        yes  Visually complete resection:                yes  Visualization of detrusor muscle in resection base:       Yes  Visual evaluation for perforation:           Yes (none)   Indication:  Raymond Wagner is a 65 y.o. male with history of high-grade Ta urothelial carcinoma of the bladder with recurrent bladder lesion. All the risks, benefits were discussed with the patient to include but not limited to infection, pain, bleeding, damage to adjacent structures, need for further operations, adverse reaction to anesthesia and death.  Patient understands these risks and agrees to  proceed with the operation as planned.    Description of procedure: After informed consent was obtained from the patient, the patient was taken to the operating room. General anesthesia was administered. The patient was placed in dorsal lithotomy position and prepped and draped in usual sterile fashion. Sequential compression devices were applied to lower extremities at the beginning of the case for DVT prophylaxis. Antibiotics were infused prior to surgery start time. A surgical time-out was performed to properly identify the patient, the surgery to be performed, and the surgical site.     We had to dilate his urethral meatus with male urethral sounds.  We then passed the 21-French rigid cystoscope down the urethra and into the bladder under direct vision without any difficulty. The anterior urethral was normal. The prostate was non-obstructing. The bladder was inspected with 30 and 70 degree lenses. Once in the bladder, systematic evaluation of bladder revealed subcentimeter papillary lesion at the prior resection site along the right posterior bladder wall.  He additionally had a 1 cm papillary lesion at the bladder neck at the 3 o'clock position. The ureteral orfices were in orthotopic position and not involved.   We then removed the cystoscope and then passed down the 26 French resectoscope sheath down the urethra into the bladder  under direct vision with the visual obturator. The tumors were resected down to muscle. The TUR bladder tumor chips were retrieved from the bladder and each region of resection was passed off the field as a separate specimen.  Hemostasis was achieved using electrocautery. We then proceeded with removing the resectoscope and then placed in a 18 Foley catheter.  The patient tolerated the procedure well with no complication and was awoken from anesthesia and taken to recovery in stable condition.     While in the post-operative care unit, as a separate procedure, 2000 mg of  gemcitabine in 50 mL of water was instilled in the bladder through the catheter and the catheter was plugged. This will remain indwelling for approximately one hour. It will then be drained from the bladder and the catheter will be removed and the patient discharged home.  Plan: Gemcitabine will remain in the bladder for approximate 1 hour.  Then, the bladder will be drained and the Foley catheter will be removed.  He will follow-up next week for pathology review.  Matt R. Xzavion Doswell MD [facility]  Pager: [phone]

## 2020-11-27 NOTE — Anesthesia Procedure Notes (Signed)
Procedure Name: LMA Insertion Date/Time: 11/27/2020 1:44 PM Performed by: Briant Sites, CRNA Pre-anesthesia Checklist: Patient identified, Emergency Drugs available, Suction available and Patient being monitored Patient Re-evaluated:Patient Re-evaluated prior to induction Oxygen Delivery Method: Circle system utilized Preoxygenation: Pre-oxygenation with 100% oxygen Induction Type: IV induction Ventilation: Mask ventilation without difficulty LMA: LMA inserted LMA Size: 5.0 Number of attempts: 1 Airway Equipment and Method: Bite block Placement Confirmation: positive ETCO2 Tube secured with: Tape Dental Injury: Teeth and Oropharynx as per pre-operative assessment

## 2020-11-27 NOTE — Anesthesia Postprocedure Evaluation (Signed)
Anesthesia Post Note  Patient: Raymond Wagner  Procedure(s) Performed: TRANSURETHRAL RESECTION OF BLADDER TUMOR (TURBT) WITH INSTILLATION OF GEMCITABINE (N/A )     Patient location during evaluation: PACU Anesthesia Type: General Level of consciousness: awake and alert Pain management: pain level controlled Vital Signs Assessment: post-procedure vital signs reviewed and stable Respiratory status: spontaneous breathing, nonlabored ventilation, respiratory function stable and patient connected to nasal cannula oxygen Cardiovascular status: blood pressure returned to baseline and stable Postop Assessment: no apparent nausea or vomiting Anesthetic complications: no   No complications documented.  Last Vitals:  Vitals:   11/27/20 1555 11/27/20 1559  BP:    Pulse: 77 78  Resp: 10 (!) 9  Temp:  (!) 36.3 C  SpO2: 100% 100%    Last Pain:  Vitals:   11/27/20 1559  TempSrc: Oral  PainSc:                  Kmari Halter L Eliz Nigg

## 2020-11-27 NOTE — Progress Notes (Signed)
Surgical site, from transurethral resection of bladder tumor, Clean dry and intact with scant amount of sanguinous drainage from tip of penis.

## 2020-11-27 NOTE — Discharge Instructions (Signed)
°  Post Anesthesia Home Care Instructions ° °Activity: °Get plenty of rest for the remainder of the day. A responsible adult should stay with you for 24 hours following the procedure.  °For the next 24 hours, DO NOT: °-Drive a car °-Operate machinery °-Drink alcoholic beverages °-Take any medication unless instructed by your physician °-Make any legal decisions or sign important papers. ° °Meals: °Start with liquid foods such as gelatin or soup. Progress to regular foods as tolerated. Avoid greasy, spicy, heavy foods. If nausea and/or vomiting occur, drink only clear liquids until the nausea and/or vomiting subsides. Call your physician if vomiting continues. ° °Special Instructions/Symptoms: °Your throat may feel dry or sore from the anesthesia or the breathing tube placed in your throat during surgery. If this causes discomfort, gargle with warm salt water. The discomfort should disappear within 24 hours. ° °If you had a scopolamine patch placed behind your ear for the management of post- operative nausea and/or vomiting: ° °1. The medication in the patch is effective for 72 hours, after which it should be removed.  Wrap patch in a tissue and discard in the trash. Wash hands thoroughly with soap and water. °2. You may remove the patch earlier than 72 hours if you experience unpleasant side effects which may include dry mouth, dizziness or visual disturbances. °3. Avoid touching the patch. Wash your hands with soap and water after contact with the patch. °  °· Activity:  You are encouraged to ambulate frequently (about every hour during waking hours) to help prevent blood clots from forming in your legs or lungs.   ° °· Diet: You should advance your diet as instructed by your physician.  It will be normal to have some bloating, nausea, and abdominal discomfort intermittently. ° °· Prescriptions:  You will be provided a prescription for pain medication to take as needed.  If your pain is not severe enough to require  the prescription pain medication, you may take extra strength Tylenol instead which will have less side effects.  You should also take a prescribed stool softener to avoid straining with bowel movements as the prescription pain medication may constipate you. ° °· What to call us about: You should call the office ([phone]) if you develop fever > 101 or develop persistent vomiting. Activity:  You are encouraged to ambulate frequently (about every hour during waking hours) to help prevent blood clots from forming in your legs or lungs.   °

## 2020-11-27 NOTE — Transfer of Care (Signed)
Immediate Anesthesia Transfer of Care Note  Patient: Raymond Wagner  Procedure(s) Performed: TRANSURETHRAL RESECTION OF BLADDER TUMOR (TURBT) WITH INSTILLATION OF GEMCITABINE (N/A )  Patient Location: PACU  Anesthesia Type:General  Level of Consciousness: drowsy  Airway & Oxygen Therapy: Patient Spontanous Breathing and Patient connected to nasal cannula oxygen  Post-op Assessment: Report given to RN  Post vital signs: Reviewed and stable  Last Vitals: 128/82, 74, 12, 100% Vitals Value Taken Time  BP    Temp    Pulse    Resp    SpO2      Last Pain:  Vitals:   11/27/20 1123  TempSrc: Oral  PainSc: 0-No pain      Patients Stated Pain Goal: 4 (11/27/20 1123)  Complications: No complications documented.

## 2020-11-27 NOTE — H&P (Signed)
Office Visit Report     10/13/2020   --------------------------------------------------------------------------------   Raymond Wagner  MRN: [id number]  DOB: [birth year], 65 year old Male  SSN: [id number]   PRIMARY CARE:  Minette Brine, MD  REFERRING:  Minette Brine, MD  PROVIDER:  Link Snuffer, Shirley Friar, M.D.  TREATING:  Rexene Alberts, M.D.  LOCATION:  [facility] [phone]     --------------------------------------------------------------------------------   CC/HPI: Raymond Wagner is a 65 year old male who is seen in follow-up with bladder cancer and to undergo surveillance cystoscopy.   He is s/p TURBT on 07/10/2020 with high-grade Ta UCB. Muscle was not present in the specimen. He underwent BCG induction and presents today for 42-month cystoscopy. He denies gross hematuria, pain, bone pain, back pain, unexpected weight loss. He has no bothersome significant lower urinary tract symptoms.   Cystoscopy identified a subcentimeter erythematous lesion on the right wall of the bladder at the prior resection site.   Patient currently denies fever, chills, sweats, nausea, vomiting, abdominal or flank pain, gross hematuria or dysuria.     ALLERGIES: None   MEDICATIONS: Aspirin 81 mg tablet,chewable  Tamsulosin Hcl 0.4 mg capsule 1 capsule PO Daily  Multivitamin     GU PSH: Bladder Instill AntiCA Agent - 09/08/2020, 09/01/2020, 08/25/2020, 08/18/2020, 08/11/2020, 08/04/2020, 07/10/2020 Cystoscopy - 05/11/2020 Cystoscopy TURBT 2-5 cm - 07/10/2020 Locm 300-399Mg /Ml Iodine,1Ml - 05/01/2020 Vasectomy - 1992       PSH Notes: Vasectomy 1992, Hernia Repair 2007 & 2018, Plastic Surgery Melanoma 1025, Hand surgery 2017   NON-GU PSH: Hand/finger Surgery - 2017 Hernia Repair, 2007 - 2018     GU PMH: Bladder Cancer overlapping sites - 09/08/2020, - 09/01/2020, - 08/25/2020, - 08/18/2020, - 08/11/2020, - 08/04/2020, - 07/18/2020 Bladder tumor/neoplasm - 08/04/2020, - 06/06/2020 Straining on  Urination - 07/18/2020 Urinary Hesitancy - 07/18/2020 Weak Urinary Stream - 07/18/2020 Urinary Retention - 07/11/2020 Gross hematuria - 04/20/2020 Nocturia - 04/20/2020 Urinary Frequency - 04/20/2020    NON-GU PMH: Skin Cancer, History    FAMILY HISTORY: 1 Daughter - Daughter Bladder Cancer - Father Diabetes - Sister father deceased - Other mother deceased - Other rheumatoid arthritis - Mother   SOCIAL HISTORY: Marital Status: Married Race: White Current Smoking Status: Patient has never smoked.   Tobacco Use Assessment Completed: Used Tobacco in last 30 days? Has never drank.  Drinks 1 caffeinated drink per day.    REVIEW OF SYSTEMS:    GU Review Male:   Patient denies frequent urination, hard to postpone urination, burning/ pain with urination, get up at night to urinate, leakage of urine, stream starts and stops, trouble starting your stream, have to strain to urinate , erection problems, and penile pain.  Gastrointestinal (Upper):   Patient denies nausea, vomiting, and indigestion/ heartburn.  Gastrointestinal (Lower):   Patient denies diarrhea and constipation.  Constitutional:   Patient denies fever, night sweats, weight loss, and fatigue.  Skin:   Patient denies skin rash/ lesion and itching.  Eyes:   Patient denies blurred vision and double vision.  Ears/ Nose/ Throat:   Patient denies sore throat and sinus problems.  Hematologic/Lymphatic:   Patient denies swollen glands and easy bruising.  Cardiovascular:   Patient denies leg swelling and chest pains.  Respiratory:   Patient denies cough and shortness of breath.  Endocrine:   Patient denies excessive thirst.  Musculoskeletal:   Patient denies back pain and joint pain.  Neurological:   Patient denies headaches and dizziness.  Psychologic:   Patient  denies depression and anxiety.   VITAL SIGNS:      10/13/2020 11:07 AM  Weight 170 lb / 77.11 kg  Height 72 in / 182.88 cm  BP 153/82 mmHg  Pulse 74 /min  Temperature  97.8 F / 36.5 C  BMI 23.1 kg/m   MULTI-SYSTEM PHYSICAL EXAMINATION:    Constitutional: Well-nourished. No physical deformities. Normally developed. Good grooming.  Respiratory: No labored breathing, no use of accessory muscles.   Cardiovascular: Normal temperature, normal extremity pulses, no swelling, no varicosities.  Gastrointestinal: No mass, no tenderness, no rigidity, non obese abdomen.     Complexity of Data:  Source Of History:  Patient, Healthcare Provider  Records Review:   Pathology Reports, Previous Doctor Records, Previous Patient Records  Urine Test Review:   Urinalysis   PROCEDURES:         Flexible Cystoscopy - 52000  Risks, benefits, and some of the potential complications of the procedure were discussed at length with the patient including infection, bleeding, voiding discomfort, urinary retention, fever, chills, sepsis, and others. All questions were answered. Informed consent was obtained. Antibiotic prophylaxis was given. Sterile technique and intraurethral analgesia were used.  Meatus:  Normal size. Normal location. Normal condition.  Urethra:  No strictures.  External Sphincter:  Normal.  Verumontanum:  Normal.  Prostate:  Mildly obstructing  Bladder Neck:  Non-obstructing.  Ureteral Orifices:  Normal location. Normal size. Normal shape. Effluxed clear urine.  Bladder:  subcentimeter erythematous lesion on the right wall of the bladder at the prior resection site.      The lower urinary tract was carefully examined. The procedure was well-tolerated and without complications. Antibiotic instructions were given. Instructions were given to call the office immediately for bloody urine, difficulty urinating, urinary retention, painful or frequent urination, fever, chills, nausea, vomiting or other illness. The patient stated that he understood these instructions and would comply with them.         Urinalysis - 81003 Dipstick Dipstick Cont'd  Color: Yellow  Bilirubin: Neg mg/dL  Appearance: Clear Ketones: Neg mg/dL  Specific Gravity: 1.010 Blood: Neg ery/uL  pH: 6.0 Protein: Neg mg/dL  Glucose: Neg mg/dL Urobilinogen: 0.2 mg/dL    Nitrites: Neg    Leukocyte Esterase: Neg leu/uL    ASSESSMENT:      ICD-10 Details  1 GU:   Bladder Cancer overlapping sites - C67.8    PLAN:           Orders Labs Urine Cytology          Document Letter(s):  Created for Patient: Clinical Summary         Notes:   #1. 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  -Recommend TURBT, instillation of gemcitabine. Surgery letter sent.   CC: Minette Brine, MD    Signed by Rexene Alberts, M.D. on 10/15/20 at 8:49 AM (EST)  Urology Preoperative H&P   Chief Complaint: Bladder cancer  History of Present Illness: Robie Vititoe is a 66 y.o. 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 No fevers or chills.   Past Medical History:  Diagnosis Date  . Bladder mass   . Familial tremor   . 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 Wears glasses     Past Surgical  History:  Procedure Laterality Date  . colonscopy  age 47  . HAND SURGERY Left 2017   cyst removed under left index finger nail  . INGUINAL HERNIA REPAIR Left 04/17/2016   Procedure: OPEN LEFT IINGUINAL HERNIA REPAIR;  Surgeon: Rodman Pickle, MD;  Location: [facility];  Service: General;  Laterality: Left;  . INGUINAL HERNIA REPAIR Right 2007  . INSERTION OF MESH Left 04/17/2016   Procedure: INSERTION OF MESH;  Surgeon: Rodman Pickle, MD;  Location: [facility];  Service: General;  Laterality: Left;  Marland Kitchen MELANOMA EXCISION  2016  and 2007   scalp area  . TRANSURETHRAL RESECTION OF BLADDER TUMOR WITH  MITOMYCIN-C N/A 07/10/2020   Procedure: TRANSURETHRAL RESECTION OF BLADDER TUMOR WITH GEMCITABINE;  Surgeon: Crista Elliot, MD;  Location: [facility];  Service: Urology;  Laterality: N/A;    Allergies:  Allergies  Allergen Reactions  . Hydrocodone     Nausea/vomitting    Family History  Problem Relation Age of Onset  . Hyperlipidemia Mother   . Hypertension Mother   . Bladder Cancer Father   . Parkinson's disease Father   . Diabetes Sister   . Heart attack Maternal Grandfather     Social History:  reports that he has never smoked. He has never used smokeless tobacco. He reports that he does not drink alcohol and does not use drugs.  ROS: A complete review of systems was performed.  All systems are negative except for pertinent findings as noted.  Physical Exam:  Vital signs in last 24 hours: Temp:  [97.1 F (36.2 C)] 97.1 F (36.2 C) (01/03 1123) Pulse Rate:  [68] 68 (01/03 1123) Resp:  [16] 16 (01/03 1123) BP: (152)/(87) 152/87 (01/03 1123) SpO2:  [99 %] 99 % (01/03 1123) Weight:  [81.8 kg] 81.8 kg (01/03 1123) Constitutional:  Alert and oriented, No acute distress Cardiovascular: Regular rate and rhythm Respiratory: Normal respiratory effort, Lungs clear bilaterally GI: Abdomen is soft, nontender, nondistended, no abdominal masses GU: No CVA tenderness Lymphatic: No lymphadenopathy Neurologic: Grossly intact, no focal deficits Psychiatric: Normal mood and affect  Laboratory Data:  No results for input(s): WBC, HGB, HCT, PLT in the last 72 hours.  No results for input(s): NA, K, CL, GLUCOSE, BUN, CALCIUM, CREATININE in the last 72 hours.  Invalid input(s): CO3   No results found for this or any previous visit (from the past 24 hour(s)). Recent Results (from the past 240 hour(s))  SARS CORONAVIRUS 2 (TAT 6-24 HRS) Nasopharyngeal Nasopharyngeal Swab     Status: None   Collection Time: 11/23/20  2:59 PM   Specimen: Nasopharyngeal Swab   Result Value Ref Range Status   SARS Coronavirus 2 NEGATIVE NEGATIVE Final    Comment: (NOTE) SARS-CoV-2 target nucleic acids are NOT DETECTED.  The SARS-CoV-2 RNA is generally detectable in upper and lower respiratory specimens during the acute phase of infection. Negative results do not preclude SARS-CoV-2 infection, do not rule out co-infections with other pathogens, and should not be used as the sole basis for treatment or other patient management decisions. Negative results must be combined with clinical observations, patient history, and epidemiological information. The expected result is Negative.  Fact Sheet for Patients: HairSlick.no  Fact Sheet for Healthcare Providers: quierodirigir.com  This test is not yet approved or cleared by the [place] FDA and  has been authorized for detection and/or diagnosis of SARS-CoV-2 by FDA under an Emergency Use Authorization (EUA). This EUA  will remain  in effect (meaning this test can be used) for the duration of the COVID-19 declaration under Se ction 564(b)(1) of the Act, 21 U.S.C. section 360bbb-3(b)(1), unless the authorization is terminated or revoked sooner.  Performed at [facility], [place] [address] [place], [place] [address]     Renal Function: No results for input(s): CREATININE in the last 168 hours. CrCl cannot be calculated (No successful lab value found.).  Radiologic Imaging: No results found.  I independently reviewed the above imaging studies.  Assessment and Plan Kaleef Sedlak is a 65 y.o. 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 Ok to proceed.   Matt R. Jose Alleyne MD 11/27/2020, 1:14 PM  [facility] Pager: [phone]

## 2020-11-28 ENCOUNTER — Encounter (HOSPITAL_BASED_OUTPATIENT_CLINIC_OR_DEPARTMENT_OTHER): Payer: Self-pay | Admitting: Urology

## 2020-11-28 LAB — SURGICAL PATHOLOGY

## 2021-09-24 DIAGNOSIS — L814 Other melanin hyperpigmentation: Secondary | ICD-10-CM | POA: Diagnosis not present

## 2021-09-24 DIAGNOSIS — L853 Xerosis cutis: Secondary | ICD-10-CM | POA: Diagnosis not present

## 2021-09-24 DIAGNOSIS — C678 Malignant neoplasm of overlapping sites of bladder: Secondary | ICD-10-CM | POA: Diagnosis not present

## 2021-09-24 DIAGNOSIS — D2372 Other benign neoplasm of skin of left lower limb, including hip: Secondary | ICD-10-CM | POA: Diagnosis not present

## 2021-09-24 DIAGNOSIS — L821 Other seborrheic keratosis: Secondary | ICD-10-CM | POA: Diagnosis not present

## 2021-09-24 DIAGNOSIS — Z5111 Encounter for antineoplastic chemotherapy: Secondary | ICD-10-CM | POA: Diagnosis not present

## 2021-10-01 DIAGNOSIS — Z5111 Encounter for antineoplastic chemotherapy: Secondary | ICD-10-CM | POA: Diagnosis not present

## 2021-10-01 DIAGNOSIS — C678 Malignant neoplasm of overlapping sites of bladder: Secondary | ICD-10-CM | POA: Diagnosis not present

## 2021-10-04 ENCOUNTER — Encounter: Payer: Self-pay | Admitting: Podiatry

## 2021-10-04 ENCOUNTER — Other Ambulatory Visit: Payer: Self-pay

## 2021-10-04 ENCOUNTER — Ambulatory Visit (INDEPENDENT_AMBULATORY_CARE_PROVIDER_SITE_OTHER): Payer: BC Managed Care – PPO

## 2021-10-04 ENCOUNTER — Ambulatory Visit: Payer: BC Managed Care – PPO | Admitting: Podiatry

## 2021-10-04 DIAGNOSIS — M2011 Hallux valgus (acquired), right foot: Secondary | ICD-10-CM | POA: Diagnosis not present

## 2021-10-04 DIAGNOSIS — M85671 Other cyst of bone, right ankle and foot: Secondary | ICD-10-CM

## 2021-10-04 DIAGNOSIS — D2371 Other benign neoplasm of skin of right lower limb, including hip: Secondary | ICD-10-CM

## 2021-10-04 DIAGNOSIS — E7800 Pure hypercholesterolemia, unspecified: Secondary | ICD-10-CM | POA: Insufficient documentation

## 2021-10-04 DIAGNOSIS — M2041 Other hammer toe(s) (acquired), right foot: Secondary | ICD-10-CM | POA: Diagnosis not present

## 2021-10-04 DIAGNOSIS — C679 Malignant neoplasm of bladder, unspecified: Secondary | ICD-10-CM | POA: Insufficient documentation

## 2021-10-04 DIAGNOSIS — D696 Thrombocytopenia, unspecified: Secondary | ICD-10-CM | POA: Insufficient documentation

## 2021-10-04 DIAGNOSIS — R251 Tremor, unspecified: Secondary | ICD-10-CM | POA: Insufficient documentation

## 2021-10-04 DIAGNOSIS — Z83438 Family history of other disorder of lipoprotein metabolism and other lipidemia: Secondary | ICD-10-CM | POA: Insufficient documentation

## 2021-10-04 NOTE — Progress Notes (Signed)
Subjective:  Patient ID: Raymond Wagner, male    DOB: 25-Jun-1956,  MRN: [id number] HPI Chief Complaint  Patient presents with   Foot Pain    1st MPJ/2nd toe right - bunion and hammertoe deformity x years, having more pain in the arch and lateral foot, callused lesion laterally, walking and shoe gear are uncomfortable   New Patient (Initial Visit)    65 y.o. male presents with the above complaint.   ROS: Denies fever chills nausea vomiting muscle aches pains calf pain back pain chest pain shortness of breath.  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, scalp    Wears glasses    Past Surgical History:  Procedure Laterality Date   colonscopy  age 37   HAND SURGERY Left 2017   cyst removed under left index finger nail   INGUINAL HERNIA REPAIR Left 04/17/2016   Procedure: OPEN LEFT IINGUINAL HERNIA REPAIR;  Surgeon: Mickeal Skinner, MD;  Location: [facility];  Service: General;  Laterality: Left;   INGUINAL HERNIA REPAIR Right 2007   INSERTION OF MESH Left 04/17/2016   Procedure: INSERTION OF MESH;  Surgeon: Mickeal Skinner, MD;  Location: [facility];  Service: General;  Laterality: Left;   MELANOMA EXCISION  2016  and 2007   scalp area   TRANSURETHRAL RESECTION OF BLADDER TUMOR N/A 11/27/2020   Procedure: TRANSURETHRAL RESECTION OF BLADDER TUMOR (TURBT) WITH INSTILLATION OF GEMCITABINE;  Surgeon: Janith Lima, MD;  Location: [facility];  Service: Urology;  Laterality: N/A;  ONLY NEEDS 30 MIN   TRANSURETHRAL RESECTION OF BLADDER TUMOR WITH MITOMYCIN-C N/A 07/10/2020   Procedure: TRANSURETHRAL RESECTION OF BLADDER TUMOR WITH GEMCITABINE;  Surgeon: Lucas Mallow, MD;  Location: [facility];  Service: Urology;  Laterality: N/A;    Current Outpatient Medications:    Multiple Vitamin (MULTIVITAMIN WITH MINERALS) TABS tablet,  Take 1 tablet by mouth daily., Disp: , Rfl:   Allergies  Allergen Reactions   Hydrocodone     Nausea/vomitting   Review of Systems Objective:  There were no vitals filed for this visit.  General: Well developed, nourished, in no acute distress, alert and oriented x3   Dermatological: Skin is warm, dry and supple bilateral. Nails x 10 are well maintained; remaining integument appears unremarkable at this time. There are no open sores, no preulcerative lesions, no rash or signs of infection present.  He has a benign skin lesions of fifth metatarsal base of the right foot porokeratosis.  Vascular: Dorsalis Pedis artery and Posterior Tibial artery pedal pulses are 2/4 bilateral with immedate capillary fill time. Pedal hair growth present. No varicosities and no lower extremity edema present bilateral.   Neruologic: Grossly intact via light touch bilateral. Vibratory intact via tuning fork bilateral. Protective threshold with Semmes Wienstein monofilament intact to all pedal sites bilateral. Patellar and Achilles deep tendon reflexes 2+ bilateral. No Babinski or clonus noted bilateral.   Musculoskeletal: No gross boney pedal deformities bilateral. No pain, crepitus, or limitation noted with foot and ankle range of motion bilateral. Muscular strength 5/5 in all groups tested bilateral.  Hallux abductovalgus deformity of the right foot is relatively rigid in nature there is bony hypertrophy around the first metatarsal medial cuneiform joint which is mildly tender on palpation and on range of motion.  There is moderate to severe hammertoe  deformity with rigid flexor contraction at the PIPJ and extensor contracture at the level of the DIPJ on the third toe right foot.  There is also some flexibility to the fourth and fifth digits of the right foot so they do not demonstrate as much of a hammertoe deformity.  Gait: Unassisted, Nonantalgic.    Radiographs:  Radiographs taken today demonstrate an  osseously mature individual with bony hypertrophy around the plantar aspect of the first metatarsal there is also a cyst in the cuneiform taking of approximately one third of the medial cuneiform.  This does not appear to be blastic of any type.  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  Severe hammertoe deformities are noted with what appears to be fractures of the proximal phalanx of the second toe in the third toe but this just could be overlap radiographically.  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  Also demonstrates severe hammertoe deformity #2 #3 with an elongated plantarflexed metatarsal.  Benign skin lesion hyperkeratotic nature right.   Plan: Discussed etiology pathology and surgical therapies I discussed surgery with him in great detail today consisting of possible fusion of the first metatarsal medial cuneiform joint as well as the first metatarsophalangeal joint I discussed this with Dr. Sherryle Lis as well he concurs.  Also a second metatarsal osteotomy shortening and nature to decompress the joint and to better align the second toe and which would need to be arthrodesis at the PIPJ as well as the third.  We are requesting a CT for evaluation of the cyst.  Debrided reactive hyperkeratotic lesion we will follow-up with him once his CT comes in.     Naheim Burgen [address] [place], [place]

## 2021-10-05 NOTE — Addendum Note (Signed)
Addended by: Rip Harbour on: 10/05/2021 07:51 AM   Modules accepted: Orders

## 2021-10-08 DIAGNOSIS — Z5111 Encounter for antineoplastic chemotherapy: Secondary | ICD-10-CM | POA: Diagnosis not present

## 2021-10-08 DIAGNOSIS — C678 Malignant neoplasm of overlapping sites of bladder: Secondary | ICD-10-CM | POA: Diagnosis not present

## 2021-10-15 ENCOUNTER — Telehealth: Payer: Self-pay | Admitting: *Deleted

## 2021-10-15 NOTE — Telephone Encounter (Signed)
[facility] is calling to request a new CT order, should be CT ,right foot w/o contrast. Please advise.

## 2021-10-22 ENCOUNTER — Telehealth: Payer: Self-pay | Admitting: Podiatry

## 2021-10-22 NOTE — Telephone Encounter (Signed)
Pt called stating he hasn't heard back from Imaging in regards to scheduling his CT scan. Please advise.

## 2021-10-29 ENCOUNTER — Other Ambulatory Visit: Payer: Self-pay | Admitting: *Deleted

## 2021-10-29 ENCOUNTER — Telehealth: Payer: Self-pay | Admitting: *Deleted

## 2021-10-29 ENCOUNTER — Encounter: Payer: Self-pay | Admitting: Podiatry

## 2021-10-29 NOTE — Telephone Encounter (Signed)
Patient is calling for status of his CT order.. Called [facility] and they cannot do a CT w/wo,has to be a CT right foot w/o only. The order has to be changed or send a new one and then they will contact patient.Please advise.

## 2021-11-21 ENCOUNTER — Ambulatory Visit
Admission: RE | Admit: 2021-11-21 | Discharge: 2021-11-21 | Disposition: A | Discharge status: Home / Self Care | Payer: BC Managed Care – PPO | Source: Ambulatory Visit | Attending: Podiatry | Admitting: Podiatry

## 2021-11-21 DIAGNOSIS — M85671 Other cyst of bone, right ankle and foot: Secondary | ICD-10-CM

## 2021-11-21 DIAGNOSIS — S92244A Nondisplaced fracture of medial cuneiform of right foot, initial encounter for closed fracture: Secondary | ICD-10-CM | POA: Diagnosis not present

## 2021-11-21 DIAGNOSIS — S92214A Nondisplaced fracture of cuboid bone of right foot, initial encounter for closed fracture: Secondary | ICD-10-CM | POA: Diagnosis not present

## 2021-11-21 DIAGNOSIS — M2011 Hallux valgus (acquired), right foot: Secondary | ICD-10-CM | POA: Diagnosis not present

## 2021-12-03 DIAGNOSIS — R972 Elevated prostate specific antigen [PSA]: Secondary | ICD-10-CM | POA: Diagnosis not present

## 2021-12-03 DIAGNOSIS — C678 Malignant neoplasm of overlapping sites of bladder: Secondary | ICD-10-CM | POA: Diagnosis not present

## 2021-12-11 ENCOUNTER — Other Ambulatory Visit: Payer: Self-pay

## 2021-12-11 ENCOUNTER — Ambulatory Visit: Payer: BC Managed Care – PPO | Admitting: Podiatry

## 2021-12-11 ENCOUNTER — Encounter: Payer: Self-pay | Admitting: Podiatry

## 2021-12-11 DIAGNOSIS — D2371 Other benign neoplasm of skin of right lower limb, including hip: Secondary | ICD-10-CM

## 2021-12-11 NOTE — Progress Notes (Signed)
He presents today stating that his foot felt better since I trimmed the callus on the lateral aspect of the foot.  The medial side is starting to feel better.  He is here today for CT results.  Objective: Vital signs are stable he is alert and oriented x3.  He has no changes on physical exam CT does demonstrate a cyst measuring at least 1/4-1/3 of the size of his medial cuneiform adjacent to the osteoarthritic first TMT.  Assessment osteoarthritis severe hallux valgus deformity.  Cyst medial cuneiform bone.  Plan: At this point I recommended that we debride the lesion once again which I did today.  I also recommended filling the cyst and removing the inside of it he understands this and is amendable to it he will follow-up with me with in the near future for questions or concerns or consult.  He understands that it could take anywhere from 6 months to a year before he would be able to perform a Lapidus procedure to correct his bunion deformity.

## 2021-12-17 ENCOUNTER — Encounter: Payer: Self-pay | Admitting: Podiatry

## 2021-12-19 NOTE — Telephone Encounter (Signed)
Called patient and gave instructions and recommendations per Dr Milinda Pointer.

## 2022-01-15 ENCOUNTER — Other Ambulatory Visit: Payer: Self-pay

## 2022-01-15 ENCOUNTER — Encounter: Payer: Self-pay | Admitting: Podiatry

## 2022-01-15 ENCOUNTER — Ambulatory Visit: Payer: BC Managed Care – PPO | Admitting: Podiatry

## 2022-01-15 DIAGNOSIS — M85671 Other cyst of bone, right ankle and foot: Secondary | ICD-10-CM

## 2022-01-15 NOTE — Progress Notes (Signed)
He presents today to discuss some possible surgery of his right foot.  He states that the cyst is really not bothering me and I am really not having any pain so he is wondering whether or not he should do any surgery at all.  Objective: Vital signs are stable he is alert and oriented x3 examination of foot demonstrates pulses remain palpable I had previously evaluated the cyst on CT and radiograph prior to his visit today.  There is no pain on palpation and range of motion of the foot.  That he does have severe hammertoe and hallux valgus deformities.  Assessment: Cyst medial cuneiform with severe hallux valgus and hammertoe deformities.  Plan: We had initially discussed this with him in great detail that he is going to have to fix this cyst before we can perform a Lapidus procedure to help fix the forefoot.  At this point he is doubting whether or not he wants to have the cyst fixed at all.  So at this point we discussed follow-up with him every 6 months for set of x-rays until we are sure that the cyst is not growing anymore if the cyst continues to grow then we will image it once again with CT and then consider filling the cyst.

## 2022-03-11 DIAGNOSIS — Z0000 Encounter for general adult medical examination without abnormal findings: Secondary | ICD-10-CM | POA: Diagnosis not present

## 2022-03-12 DIAGNOSIS — E7800 Pure hypercholesterolemia, unspecified: Secondary | ICD-10-CM | POA: Diagnosis not present

## 2022-03-15 DIAGNOSIS — H40013 Open angle with borderline findings, low risk, bilateral: Secondary | ICD-10-CM | POA: Diagnosis not present

## 2022-03-15 DIAGNOSIS — H2513 Age-related nuclear cataract, bilateral: Secondary | ICD-10-CM | POA: Diagnosis not present

## 2022-03-15 DIAGNOSIS — H524 Presbyopia: Secondary | ICD-10-CM | POA: Diagnosis not present

## 2022-04-01 DIAGNOSIS — Z5111 Encounter for antineoplastic chemotherapy: Secondary | ICD-10-CM | POA: Diagnosis not present

## 2022-04-01 DIAGNOSIS — C678 Malignant neoplasm of overlapping sites of bladder: Secondary | ICD-10-CM | POA: Diagnosis not present

## 2022-04-08 DIAGNOSIS — C678 Malignant neoplasm of overlapping sites of bladder: Secondary | ICD-10-CM | POA: Diagnosis not present

## 2022-04-08 DIAGNOSIS — Z5111 Encounter for antineoplastic chemotherapy: Secondary | ICD-10-CM | POA: Diagnosis not present

## 2022-04-15 DIAGNOSIS — N401 Enlarged prostate with lower urinary tract symptoms: Secondary | ICD-10-CM | POA: Diagnosis not present

## 2022-04-15 DIAGNOSIS — Z5111 Encounter for antineoplastic chemotherapy: Secondary | ICD-10-CM | POA: Diagnosis not present

## 2022-04-15 DIAGNOSIS — R350 Frequency of micturition: Secondary | ICD-10-CM | POA: Diagnosis not present

## 2022-04-15 DIAGNOSIS — C678 Malignant neoplasm of overlapping sites of bladder: Secondary | ICD-10-CM | POA: Diagnosis not present

## 2022-04-29 DIAGNOSIS — C678 Malignant neoplasm of overlapping sites of bladder: Secondary | ICD-10-CM | POA: Diagnosis not present

## 2022-05-27 DIAGNOSIS — D2372 Other benign neoplasm of skin of left lower limb, including hip: Secondary | ICD-10-CM | POA: Diagnosis not present

## 2022-05-27 DIAGNOSIS — L821 Other seborrheic keratosis: Secondary | ICD-10-CM | POA: Diagnosis not present

## 2022-05-27 DIAGNOSIS — Z08 Encounter for follow-up examination after completed treatment for malignant neoplasm: Secondary | ICD-10-CM | POA: Diagnosis not present

## 2022-05-27 DIAGNOSIS — L814 Other melanin hyperpigmentation: Secondary | ICD-10-CM | POA: Diagnosis not present

## 2022-07-16 ENCOUNTER — Ambulatory Visit: Payer: BC Managed Care – PPO | Admitting: Podiatry

## 2022-07-18 ENCOUNTER — Ambulatory Visit: Payer: BC Managed Care – PPO | Admitting: Podiatry

## 2022-08-12 ENCOUNTER — Ambulatory Visit: Payer: BC Managed Care – PPO | Admitting: Podiatry

## 2022-08-12 ENCOUNTER — Telehealth: Payer: Self-pay | Admitting: *Deleted

## 2022-08-12 ENCOUNTER — Ambulatory Visit (INDEPENDENT_AMBULATORY_CARE_PROVIDER_SITE_OTHER): Payer: BC Managed Care – PPO

## 2022-08-12 DIAGNOSIS — D2371 Other benign neoplasm of skin of right lower limb, including hip: Secondary | ICD-10-CM | POA: Diagnosis not present

## 2022-08-12 DIAGNOSIS — M85671 Other cyst of bone, right ankle and foot: Secondary | ICD-10-CM

## 2022-08-12 NOTE — Progress Notes (Signed)
He presents today for follow-up of pain to his right foot.  He states that it does not really hurt up and here as he points to the medial cuneiform.  But states that he still has pain across the forefoot right foot.  Objective: Vital signs are stable he is alert and oriented x3.  Severe hallux valgus deformity hammertoe deformities are noted right foot.  Radiographs taken today for surveillance of his cyst to the medial cuneiform this preventing surgical correction.  At this point it appears that the cyst is not as large as it was previously.  Assessment severe osteoarthritis cystic changes medial cuneiform.  Plan: We are requesting a surveillance CT just to make sure that this lesion is not growing or requesting CT with contrast.  We will consider surgical planning after this.

## 2022-08-15 ENCOUNTER — Ambulatory Visit: Payer: BC Managed Care – PPO | Admitting: Podiatry

## 2022-08-23 NOTE — Telephone Encounter (Signed)
error 

## 2022-09-03 ENCOUNTER — Ambulatory Visit
Admission: RE | Admit: 2022-09-03 | Discharge: 2022-09-03 | Disposition: A | Discharge status: Home / Self Care | Payer: BC Managed Care – PPO | Source: Ambulatory Visit | Attending: Podiatry | Admitting: Podiatry

## 2022-09-03 DIAGNOSIS — M85671 Other cyst of bone, right ankle and foot: Secondary | ICD-10-CM

## 2022-09-03 DIAGNOSIS — M19071 Primary osteoarthritis, right ankle and foot: Secondary | ICD-10-CM | POA: Diagnosis not present

## 2022-09-03 DIAGNOSIS — M2011 Hallux valgus (acquired), right foot: Secondary | ICD-10-CM | POA: Diagnosis not present

## 2022-10-10 ENCOUNTER — Ambulatory Visit: Payer: BC Managed Care – PPO | Admitting: Podiatry

## 2022-10-28 DIAGNOSIS — Z5111 Encounter for antineoplastic chemotherapy: Secondary | ICD-10-CM | POA: Diagnosis not present

## 2022-10-28 DIAGNOSIS — C678 Malignant neoplasm of overlapping sites of bladder: Secondary | ICD-10-CM | POA: Diagnosis not present

## 2022-10-30 ENCOUNTER — Ambulatory Visit: Payer: BC Managed Care – PPO | Admitting: Podiatry

## 2022-10-30 ENCOUNTER — Encounter: Payer: Self-pay | Admitting: Podiatry

## 2022-10-30 DIAGNOSIS — L600 Ingrowing nail: Secondary | ICD-10-CM | POA: Diagnosis not present

## 2022-10-30 NOTE — Progress Notes (Signed)
Subjective:   Patient ID: Raymond Wagner, male   DOB: 66 y.o.   MRN: [id number]   HPI Patient states has had a chronic ingrown toenail on his right big toe that is been very sore over the last month and he has tried to trim it and soak it is somewhat better but still tender and is got severe foot structural issues   ROS      Objective:  Physical Exam  Neurovascular status intact severe forefoot deformity right foot with incurvated lateral border right hallux painful when pressed     Assessment:  Chronic ingrown toenail deformity right hallux lateral border along with structural changes with pressure of the foot     Plan:  H&P reviewed condition and at this point recommended correction of the nail and explained procedure risk patient wants surgery.  Patient at this point had the consent form read and signed and today I infiltrated the right hallux 60 mg like Marcaine mixture sterile prep done using sterile instrumentation removed the lateral border exposed matrix applied phenol 3 applications 30 seconds followed by alcohol by sterile dressing gave instructions on soaks and to wear dressing for 24 hours take it off earlier if throbbing were to occur and encouraged him to call questions concerns which may arise

## 2022-10-30 NOTE — Patient Instructions (Signed)

## 2022-11-04 DIAGNOSIS — C678 Malignant neoplasm of overlapping sites of bladder: Secondary | ICD-10-CM | POA: Diagnosis not present

## 2022-11-04 DIAGNOSIS — Z5111 Encounter for antineoplastic chemotherapy: Secondary | ICD-10-CM | POA: Diagnosis not present

## 2022-11-11 DIAGNOSIS — C678 Malignant neoplasm of overlapping sites of bladder: Secondary | ICD-10-CM | POA: Diagnosis not present

## 2022-11-11 DIAGNOSIS — Z5111 Encounter for antineoplastic chemotherapy: Secondary | ICD-10-CM | POA: Diagnosis not present

## 2022-11-11 DIAGNOSIS — R972 Elevated prostate specific antigen [PSA]: Secondary | ICD-10-CM | POA: Diagnosis not present

## 2022-12-02 DIAGNOSIS — N401 Enlarged prostate with lower urinary tract symptoms: Secondary | ICD-10-CM | POA: Diagnosis not present

## 2022-12-02 DIAGNOSIS — C678 Malignant neoplasm of overlapping sites of bladder: Secondary | ICD-10-CM | POA: Diagnosis not present

## 2022-12-02 DIAGNOSIS — R972 Elevated prostate specific antigen [PSA]: Secondary | ICD-10-CM | POA: Diagnosis not present

## 2022-12-02 DIAGNOSIS — R350 Frequency of micturition: Secondary | ICD-10-CM | POA: Diagnosis not present

## 2023-03-03 DIAGNOSIS — Z1211 Encounter for screening for malignant neoplasm of colon: Secondary | ICD-10-CM | POA: Diagnosis not present

## 2023-03-03 DIAGNOSIS — K648 Other hemorrhoids: Secondary | ICD-10-CM | POA: Diagnosis not present

## 2023-03-17 DIAGNOSIS — E7800 Pure hypercholesterolemia, unspecified: Secondary | ICD-10-CM | POA: Diagnosis not present

## 2023-03-17 DIAGNOSIS — Z125 Encounter for screening for malignant neoplasm of prostate: Secondary | ICD-10-CM | POA: Diagnosis not present

## 2023-03-17 DIAGNOSIS — D696 Thrombocytopenia, unspecified: Secondary | ICD-10-CM | POA: Diagnosis not present

## 2023-03-17 DIAGNOSIS — R251 Tremor, unspecified: Secondary | ICD-10-CM | POA: Diagnosis not present

## 2023-03-24 DIAGNOSIS — Z0000 Encounter for general adult medical examination without abnormal findings: Secondary | ICD-10-CM | POA: Diagnosis not present

## 2023-03-31 DIAGNOSIS — H40013 Open angle with borderline findings, low risk, bilateral: Secondary | ICD-10-CM | POA: Diagnosis not present

## 2023-03-31 DIAGNOSIS — H2513 Age-related nuclear cataract, bilateral: Secondary | ICD-10-CM | POA: Diagnosis not present

## 2023-03-31 DIAGNOSIS — H524 Presbyopia: Secondary | ICD-10-CM | POA: Diagnosis not present

## 2023-04-10 ENCOUNTER — Ambulatory Visit: Payer: BC Managed Care – PPO | Admitting: Podiatry

## 2023-04-10 ENCOUNTER — Ambulatory Visit (INDEPENDENT_AMBULATORY_CARE_PROVIDER_SITE_OTHER): Payer: BC Managed Care – PPO

## 2023-04-10 DIAGNOSIS — M85671 Other cyst of bone, right ankle and foot: Secondary | ICD-10-CM

## 2023-04-11 NOTE — Progress Notes (Signed)
He presents today for follow-up having seen them since September of last year with a chief concern his bone cyst.  And whether or not he is able to have surgery for his osteoarthritis hammertoe deformities and bunion deformity.  He states that it really does not hurt.  Objective: Vital signs stable alert and oriented x 3.  Pulses are palpable.  Severe valgus deformity becoming more rigid over time crowding of the toes which are severe hammertoe deformities.  Radiographs taken today demonstrate healing of the cyst in the cuneiform.  Assessment healing cyst medial cuneiform.  Hallux valgus deformity hammertoe deformities.  Plan: Discussed in great detail today the options of surgery versus nonsurgical treatments he is going to wait 6 months and then consider a CT scan and possibly surgery at that time.

## 2023-05-05 DIAGNOSIS — C678 Malignant neoplasm of overlapping sites of bladder: Secondary | ICD-10-CM | POA: Diagnosis not present

## 2023-05-05 DIAGNOSIS — Z5111 Encounter for antineoplastic chemotherapy: Secondary | ICD-10-CM | POA: Diagnosis not present

## 2023-05-12 DIAGNOSIS — C678 Malignant neoplasm of overlapping sites of bladder: Secondary | ICD-10-CM | POA: Diagnosis not present

## 2023-05-12 DIAGNOSIS — Z5111 Encounter for antineoplastic chemotherapy: Secondary | ICD-10-CM | POA: Diagnosis not present

## 2023-05-16 IMAGING — CT CT FOOT*R* W/O CM
2 series · 9 of 27 positions shown, 11 images · non-contrast
Comparison: 10/04/2021

CLINICAL DATA: Evaluate for medial cuneiform bone cyst

EXAM:
CT OF THE RIGHT FOOT WITHOUT CONTRAST
TECHNIQUE: Multidetector CT imaging of the right foot was performed according
to the standard protocol. Multiplanar CT image reconstructions were
also generated.

[Series 4: soft tissue lower extremity · axial · 0.64mm/px · z∈[-250,-158]mm · 4 of 68 slices shown, 5 images]
[im 11/68  soft-tissue]
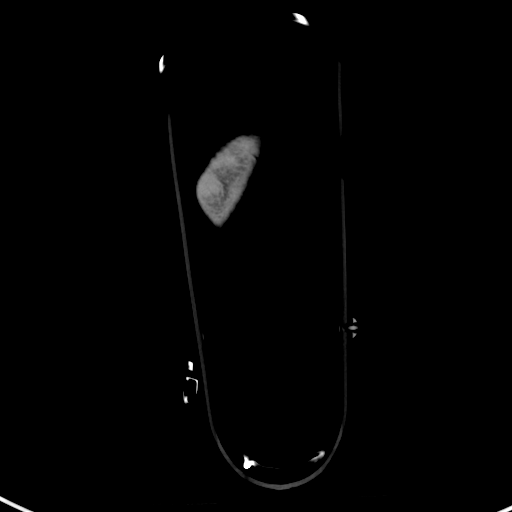
[im 11/68  bone]
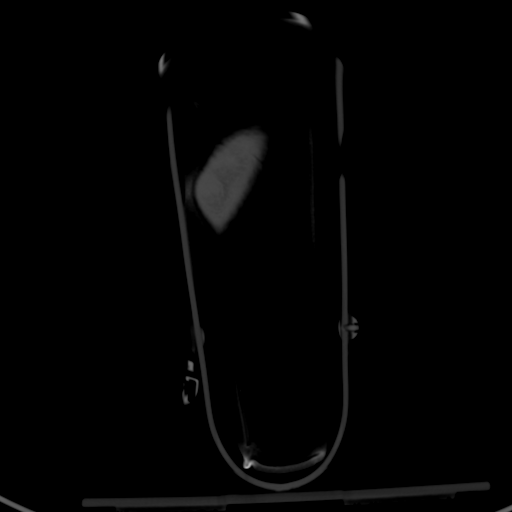
[im 26/68  bone]
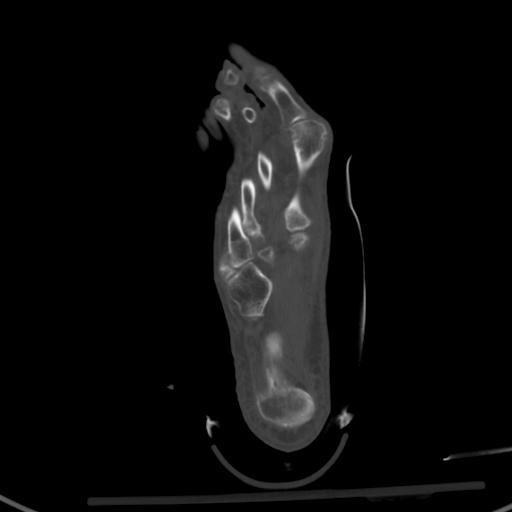
[im 42/68  bone]
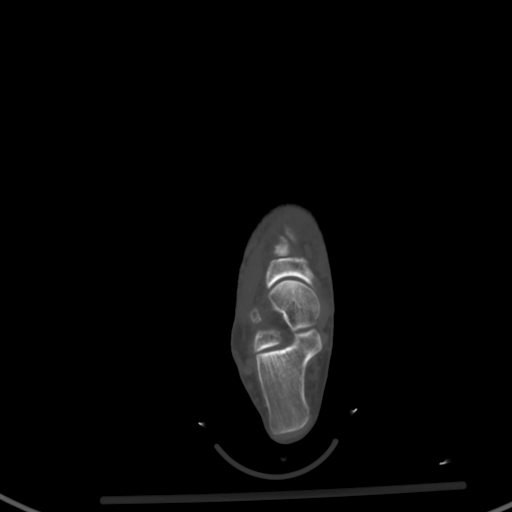
[im 57/68  bone]
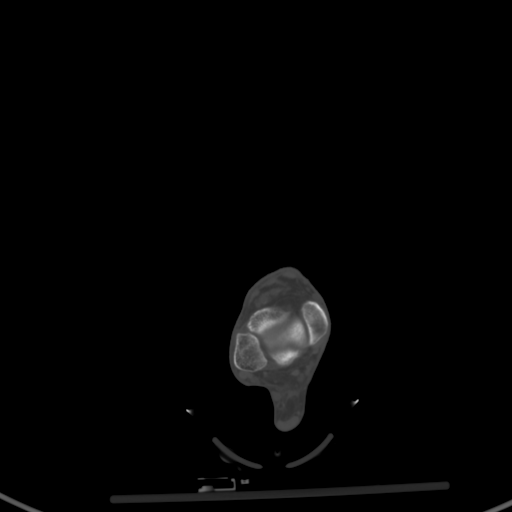

[Series 10: sagsoft tissue · sagittal · 0.25mm/px · 5 of 76 slices shown, 6 images]
[im 26/76  bone]
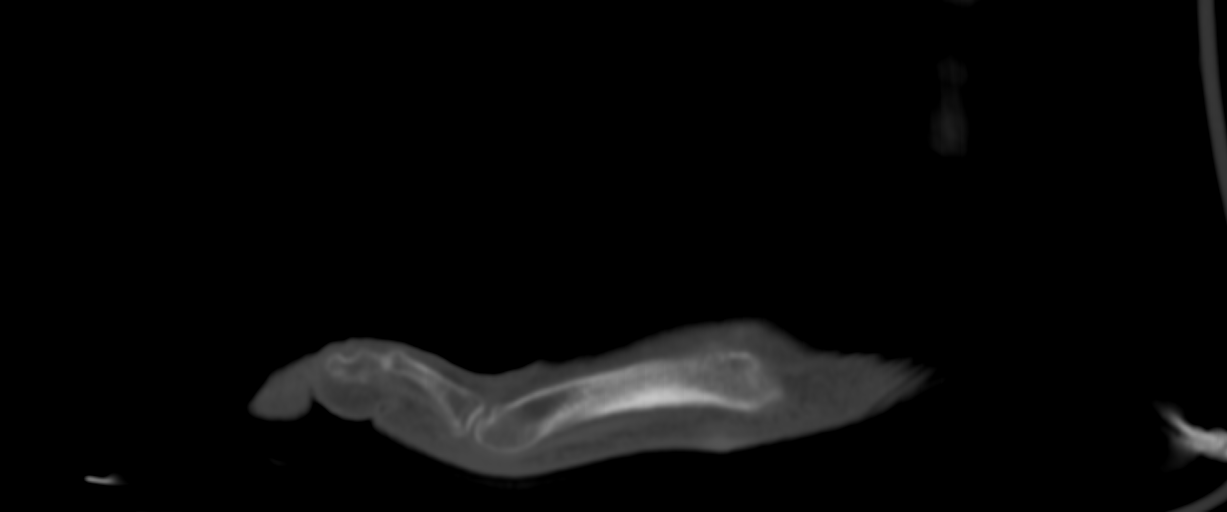
[im 32/76  bone]
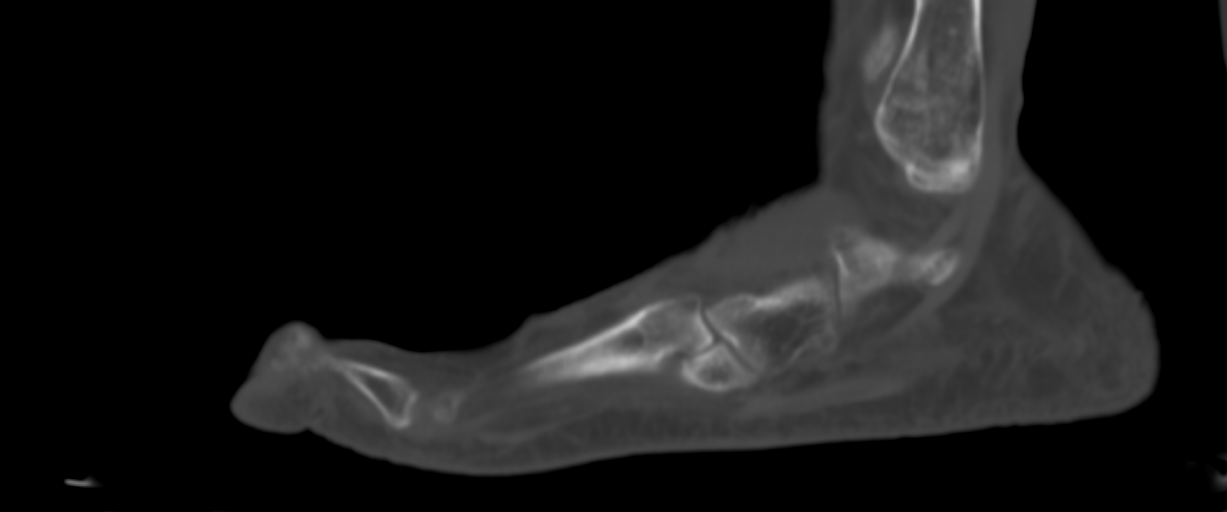
[im 38/76  soft-tissue]
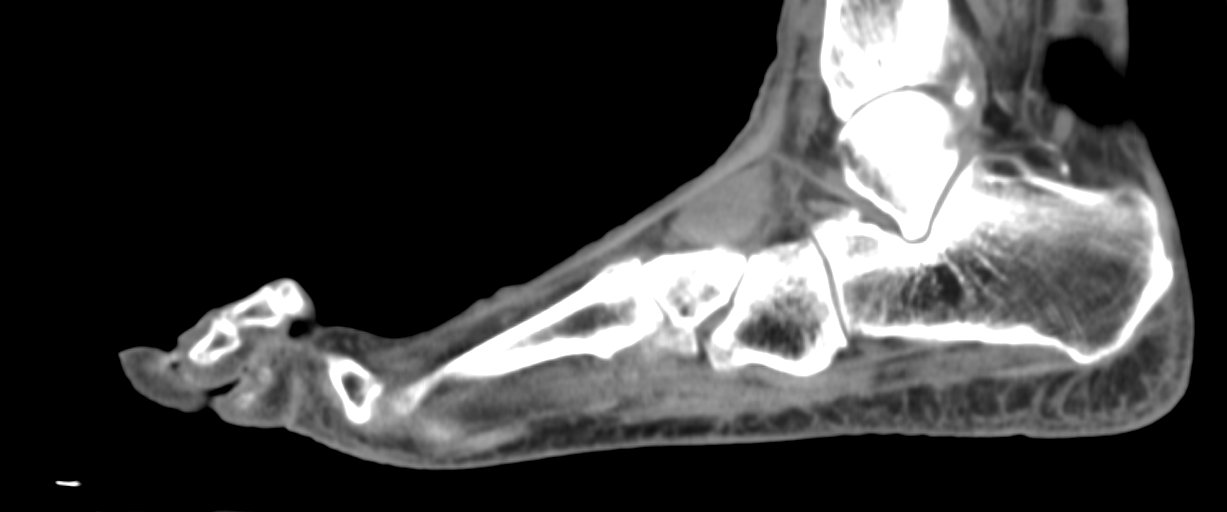
[im 38/76  bone]
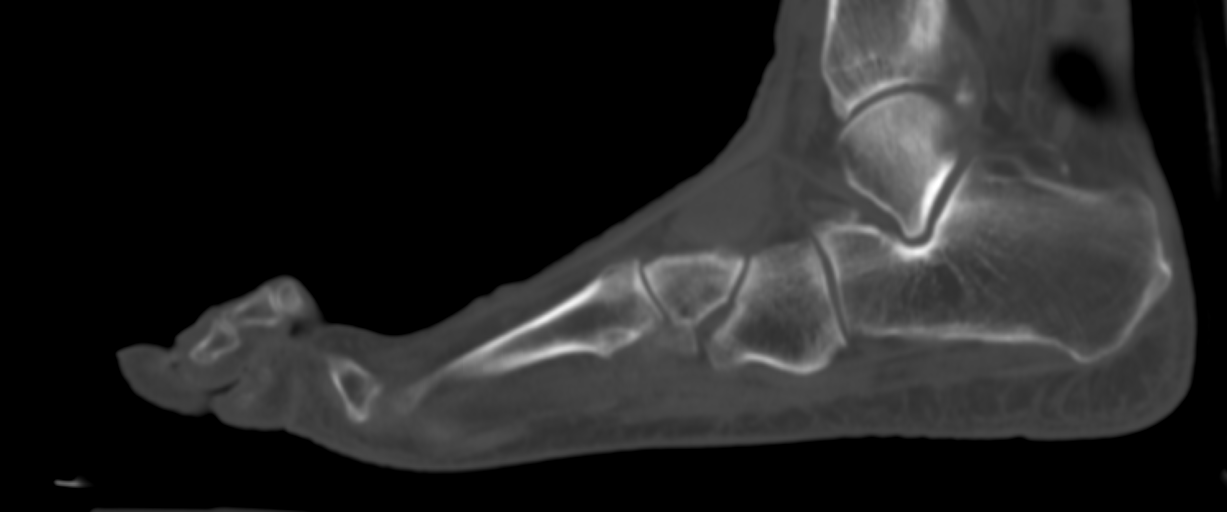
[im 44/76  bone]
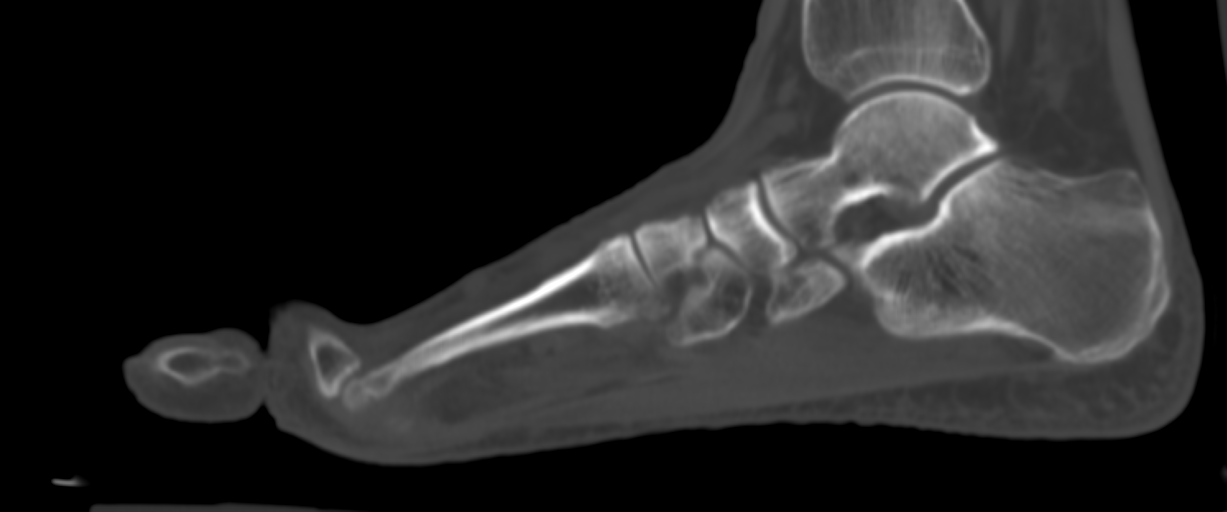
[im 51/76  bone]
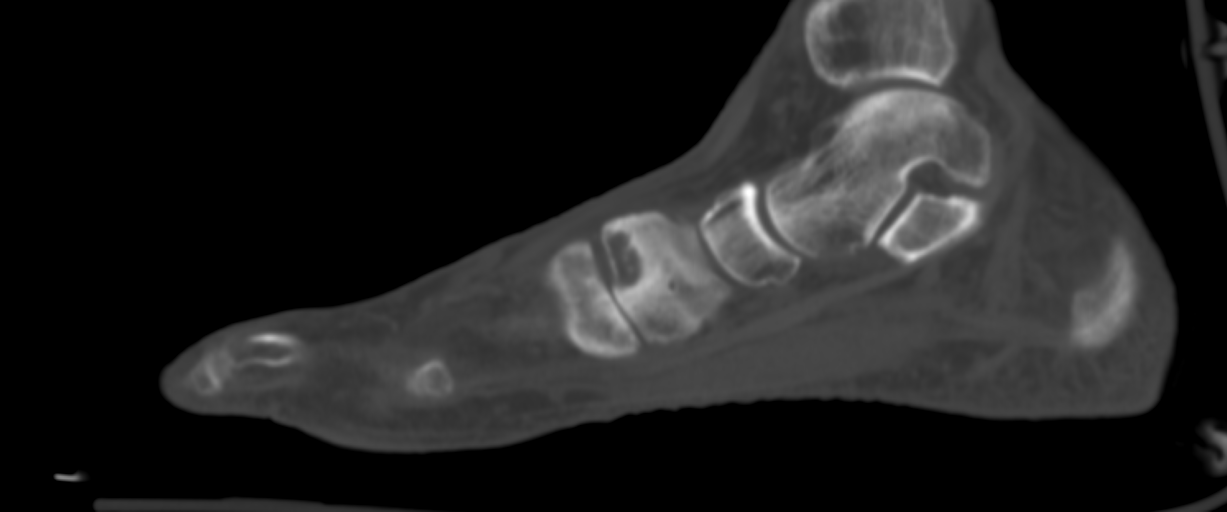

[9 of 27 positions shown; findings below may reference images not displayed]

FINDINGS: Bones/Joint/Cartilage

Subchondral 10 x 10 x 13 mm lucent lesion in the distal medial
cuneiform with a few bone spicules centrally. No periosteal reaction
or bone destruction. Nondisplaced fracture of the articular surface
of the medial cuneiform at the TMT joint with 1 mm of depression.

No other acute fracture or dislocation. Hallux valgus. Severe
osteoarthritis of the first MTP joint with mild lateral subluxation.
No joint effusion.

Ligaments

Ligaments are suboptimally evaluated by CT.

Muscles and Tendons
Muscles are normal. No muscle atrophy. Flexor, extensor, peroneal
and Achilles tendons are grossly intact.

Soft tissue
No fluid collection or hematoma. No soft tissue mass.
IMPRESSION: 1. Subchondral 10 x 10 x 13 mm lucent lesion in the distal medial
cuneiform without aggressive features. Differential considerations
include an intraosseous ganglion cyst versus enchondroma.
Nondisplaced fracture of the articular surface of the medial
cuneiform at the TMT joint with 1 mm of depression resulting from
the lucent bone lesion.
2. Hallux valgus. Severe osteoarthritis of the first MTP joint with
mild lateral subluxation.

## 2023-05-19 DIAGNOSIS — Z5111 Encounter for antineoplastic chemotherapy: Secondary | ICD-10-CM | POA: Diagnosis not present

## 2023-05-19 DIAGNOSIS — C678 Malignant neoplasm of overlapping sites of bladder: Secondary | ICD-10-CM | POA: Diagnosis not present

## 2023-06-02 DIAGNOSIS — L308 Other specified dermatitis: Secondary | ICD-10-CM | POA: Diagnosis not present

## 2023-06-02 DIAGNOSIS — D2372 Other benign neoplasm of skin of left lower limb, including hip: Secondary | ICD-10-CM | POA: Diagnosis not present

## 2023-06-02 DIAGNOSIS — L814 Other melanin hyperpigmentation: Secondary | ICD-10-CM | POA: Diagnosis not present

## 2023-06-02 DIAGNOSIS — R350 Frequency of micturition: Secondary | ICD-10-CM | POA: Diagnosis not present

## 2023-06-02 DIAGNOSIS — N401 Enlarged prostate with lower urinary tract symptoms: Secondary | ICD-10-CM | POA: Diagnosis not present

## 2023-06-02 DIAGNOSIS — L821 Other seborrheic keratosis: Secondary | ICD-10-CM | POA: Diagnosis not present

## 2023-06-02 DIAGNOSIS — R8289 Other abnormal findings on cytological and histological examination of urine: Secondary | ICD-10-CM | POA: Diagnosis not present

## 2023-06-02 DIAGNOSIS — C678 Malignant neoplasm of overlapping sites of bladder: Secondary | ICD-10-CM | POA: Diagnosis not present

## 2023-06-23 DIAGNOSIS — E7800 Pure hypercholesterolemia, unspecified: Secondary | ICD-10-CM | POA: Diagnosis not present

## 2023-06-24 ENCOUNTER — Ambulatory Visit: Payer: BC Managed Care – PPO | Admitting: Podiatry

## 2023-10-03 ENCOUNTER — Encounter: Payer: Self-pay | Admitting: Podiatry

## 2023-10-03 DIAGNOSIS — M85671 Other cyst of bone, right ankle and foot: Secondary | ICD-10-CM

## 2023-10-07 ENCOUNTER — Encounter: Payer: Self-pay | Admitting: Podiatry

## 2023-10-16 ENCOUNTER — Ambulatory Visit
Admission: RE | Admit: 2023-10-16 | Discharge: 2023-10-16 | Disposition: A | Discharge status: Home / Self Care | Payer: BC Managed Care – PPO | Source: Ambulatory Visit | Attending: Podiatry | Admitting: Podiatry

## 2023-10-16 DIAGNOSIS — M85671 Other cyst of bone, right ankle and foot: Secondary | ICD-10-CM | POA: Diagnosis not present

## 2023-10-16 DIAGNOSIS — M2011 Hallux valgus (acquired), right foot: Secondary | ICD-10-CM | POA: Diagnosis not present

## 2023-10-16 DIAGNOSIS — M19071 Primary osteoarthritis, right ankle and foot: Secondary | ICD-10-CM | POA: Diagnosis not present

## 2023-10-16 MED ORDER — IOPAMIDOL (ISOVUE-370) INJECTION 76%
200.0000 mL | Freq: Once | INTRAVENOUS | Status: AC | PRN
  Administered 2023-10-16: 80 mL via INTRAVENOUS

## 2023-10-28 ENCOUNTER — Encounter: Payer: Self-pay | Admitting: Podiatry

## 2023-11-03 DIAGNOSIS — C678 Malignant neoplasm of overlapping sites of bladder: Secondary | ICD-10-CM | POA: Diagnosis not present

## 2023-11-10 DIAGNOSIS — C678 Malignant neoplasm of overlapping sites of bladder: Secondary | ICD-10-CM | POA: Diagnosis not present

## 2023-11-17 DIAGNOSIS — C678 Malignant neoplasm of overlapping sites of bladder: Secondary | ICD-10-CM | POA: Diagnosis not present

## 2023-12-02 ENCOUNTER — Ambulatory Visit: Payer: BC Managed Care – PPO | Admitting: Podiatry

## 2023-12-09 ENCOUNTER — Ambulatory Visit: Payer: BC Managed Care – PPO | Admitting: Podiatry

## 2023-12-23 ENCOUNTER — Ambulatory Visit (INDEPENDENT_AMBULATORY_CARE_PROVIDER_SITE_OTHER): Payer: BC Managed Care – PPO

## 2023-12-23 ENCOUNTER — Ambulatory Visit: Payer: BC Managed Care – PPO | Admitting: Podiatry

## 2023-12-23 ENCOUNTER — Encounter: Payer: Self-pay | Admitting: Podiatry

## 2023-12-23 DIAGNOSIS — M2041 Other hammer toe(s) (acquired), right foot: Secondary | ICD-10-CM

## 2023-12-23 DIAGNOSIS — M85671 Other cyst of bone, right ankle and foot: Secondary | ICD-10-CM

## 2023-12-23 DIAGNOSIS — E559 Vitamin D deficiency, unspecified: Secondary | ICD-10-CM

## 2023-12-23 DIAGNOSIS — M2011 Hallux valgus (acquired), right foot: Secondary | ICD-10-CM | POA: Diagnosis not present

## 2023-12-23 DIAGNOSIS — M21961 Unspecified acquired deformity of right lower leg: Secondary | ICD-10-CM

## 2023-12-23 DIAGNOSIS — M21611 Bunion of right foot: Secondary | ICD-10-CM

## 2023-12-24 NOTE — Progress Notes (Signed)
Subjective:  Patient ID: Raymond Wagner, male    DOB: [birth year],  MRN: [id number]  Chief Complaint  Patient presents with   Bunions    RM#9 Right foot bunion and hammertoes causing discomfort also needs corn removal.    Discussed the use of AI scribe software for clinical note transcription with the patient, who gave verbal consent to proceed.  History of Present Illness   The patient presents with foot pain and deformities for evaluation and treatment options.  He experiences significant foot pain, particularly when walking at work, which he attributes to calluses and arthritis. The pain is described as a shooting sensation across the foot, primarily under the middle of the arch, and is not constant. It worsens throughout the workweek due to prolonged walking.  He has a history of bunion and hammer toe deformities, with the second and third toes being the most affected. The bunion is sometimes painful, but the hammer toe causes significant discomfort.  Arthritis pain is located in the joint where he has a cyst, which has shown some improvement over time.  He works at [employer], which requires him to be on his feet for extended periods, impacting his ability to walk quickly and perform his job efficiently. He wants to maintain his functional ability and continue engaging in activities and hobbies.  He is currently taking rosuvastatin and has a history of bladder cancer, for which he has completed treatment and has been clear for three years. He undergoes cystoscopy every six months as a follow-up. No smoking or diabetes, and his circulation is reportedly good.          Objective:    Physical Exam   MUSCULOSKELETAL: Hallux valgus deformity with a medial bunion that is tender. Abduction of the hallux and plantar medial callus observed. Pain to palpation of the first tarsal and metatarsal joint and with range of motion of the first ray. Good range of motion of the first MTP joint with  mild crepitance along the sesamoids, which is not painful. Significant hammer toe contractures observed, described as rigid in nature affecting the second and third toes, flexible and reducible of the fourth and fifth toes. Plantar metatarsal heads prominence observed. Gastrocnemius equinus noted.       No images are attached to the encounter.    Results   RADIOLOGY CT scan: Significant osteoarthritis of the first metatarsal joint, severe subchondral cyst, and osteochondral lesion consolidated (10/16/2023)   XRay shows hallux valgus deformities and hammertoe contracture and improvement in cyst of medial cuneiform  DIAGNOSTIC Cystoscopy: Clear for three years      Assessment:   1. Cyst of bone of right foot   2. Vitamin D deficiency   3. Hallux valgus with bunions, right   4. Hammertoe of right foot   5. Deformity of metatarsal bone of right foot      Plan:  Patient was evaluated and treated and all questions answered.  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 Pain and functional impairment worsen with prolonged walking at work. Nonoperative treatments have been exhausted, and the condition is expected to worsen. Surgical treatment, including arthrodesis with Lapidus bunionectomy, bone grafting from the heel and cadaver, and bone marrow aspirate, was discussed. Risks include infection, nonhealing, neurovascular compromise, and potential nerve damage, with a success rate in the low 90 percentile.  Recovery involves non-weightbearing for 4-6 weeks, weightbearing in a boot for 4-6 weeks, and approximately 12 weeks off work. Plan to perform arthrodesis with Lapidus bunionectomy of the first TMT joint using bone graft from the heel and cadaver bone with bone marrow aspirate to span the bony  defect.  Hallux Valgus Deformity Hallux valgus deformity with a tender medial bunion contributes to overall foot pain and functional limitations. Akin osteotomy was discussed to address residual hallux valgus and hallux interphalangeus deformity following the Lapidus. Plan to perform Akin osteotomyto address these deformities.  Hammer Toe Deformities Significant hammer toe contractures are rigid in the second and third toes, flexible and reducible in the fourth and fifth toes, contributing to pain and functional limitations. Discussed plan to correct with PIPJ arthrodesis, hammer toe correction, possible flexor tendon transfer for the second and third toes, and flexor tenotomy of the fourth and fifth toes.    Gastrocnemius Equinus Gastrocnemius equinus contributes to increased pressure on the ball of the foot and exacerbates forefoot deformities. Gastrocnemius recession was discussed to reduce equinus contracture. Plan to perform gastrocnemius recession.  First Metatarsophalangeal (MTP) Joint Osteoarthritis Mild osteoarthritis in the first MTP joint presents with good range of motion and mild crepitance along the sesamoids, but no significant pain currently. Monitor and address if symptoms progress.  General Health Maintenance Not diabetic, does not smoke, and has good circulation. Bone health appears adequate based on x-ray findings. Discussed checking vitamin D and calcium levels. Plan to check vitamin D and calcium levels.  Follow-up Schedule surgery for late March. Plan for non-weightbearing for 4-6 weeks followed by weightbearing in a boot for 4-6 weeks. Expect approximately 12 weeks off work.       Surgical plan:  Procedure: -Right foot Lapidus bunionectomy, Akin osteotomy, bone graft from cadaver and heel with bone marrow aspirate from the leg, second and third shortening metatarsal osteotomies and second and third PIPJ fusion, 4 and 5 flexor  tenotomy's  Location: -[facility]  Anesthesia plan: -General With block  Postoperative pain plan: - Tylenol 1000 mg every 6 hours,gabapentin 300 mg every 8 hours x5 days, oxycodone 5 mg 1-2 tabs every 6 hours only as needed  DVT prophylaxis: -Xarelto 10 mg nightly  WB Restrictions / DME needs: -NWB in splint postop     No follow-ups on file.

## 2023-12-26 ENCOUNTER — Encounter: Payer: Self-pay | Admitting: Podiatry

## 2023-12-30 ENCOUNTER — Encounter: Payer: Self-pay | Admitting: Podiatry

## 2023-12-30 LAB — CALCIUM: Calcium: 9.2 mg/dL (ref 8.6–10.2)

## 2023-12-30 LAB — VITAMIN D 25 HYDROXY (VIT D DEFICIENCY, FRACTURES): Vit D, 25-Hydroxy: 38.5 ng/mL (ref 30.0–100.0)

## 2024-01-01 ENCOUNTER — Telehealth: Payer: Self-pay | Admitting: Podiatry

## 2024-01-01 NOTE — Telephone Encounter (Signed)
 Called patient (returned his call) -  he wanted to know the fax number to send FMLA paperwork to -- has an April surgery date and will be working with [employer] ....  Gave him [phone] ....    J. Abbott -- 01/01/2024

## 2024-01-19 ENCOUNTER — Telehealth: Payer: Self-pay | Admitting: Podiatry

## 2024-01-19 NOTE — Telephone Encounter (Signed)
 DOS-03/05/24  Ralene Bathe [id number] LAPIDUS BUNIONECTOMY [id number] MET. OSTEOTOMY X2 [id number] HAMMERTOE REPAIR X2 RT-[id number] TENOTOMY X2 RT-28010  [employer] EFFECTIVE DATE-02/20/23   SPOKE WITH CHRIS N FROM [employer] AND SHE STATED THAT PRIOR AUTH IS NOT REQUIRED FOR CPT CODES 28310,28297,28308,28285 AND 95621.  CALL REF #: CHRIS N 01/19/24 @ 9:23 AM EST

## 2024-02-04 ENCOUNTER — Encounter: Payer: Self-pay | Admitting: Podiatry

## 2024-02-09 ENCOUNTER — Telehealth: Payer: Self-pay | Admitting: Podiatry

## 2024-02-09 NOTE — Telephone Encounter (Signed)
 Faxed [employer] [phone] FMLA/STD/notes

## 2024-03-05 ENCOUNTER — Other Ambulatory Visit: Payer: Self-pay | Admitting: Podiatry

## 2024-03-05 ENCOUNTER — Other Ambulatory Visit: Payer: Self-pay

## 2024-03-05 DIAGNOSIS — M2011 Hallux valgus (acquired), right foot: Secondary | ICD-10-CM | POA: Diagnosis not present

## 2024-03-05 DIAGNOSIS — M2041 Other hammer toe(s) (acquired), right foot: Secondary | ICD-10-CM | POA: Diagnosis not present

## 2024-03-05 DIAGNOSIS — M7741 Metatarsalgia, right foot: Secondary | ICD-10-CM | POA: Diagnosis not present

## 2024-03-05 DIAGNOSIS — Z9889 Other specified postprocedural states: Secondary | ICD-10-CM

## 2024-03-05 DIAGNOSIS — M216X1 Other acquired deformities of right foot: Secondary | ICD-10-CM | POA: Diagnosis not present

## 2024-03-05 DIAGNOSIS — M89771 Major osseous defect, right ankle and foot: Secondary | ICD-10-CM | POA: Diagnosis not present

## 2024-03-05 DIAGNOSIS — M85671 Other cyst of bone, right ankle and foot: Secondary | ICD-10-CM

## 2024-03-05 MED ORDER — OXYCODONE HCL 5 MG PO TABS: 5.0000 mg | ORAL_TABLET | ORAL | 0 refills | Status: AC | PRN

## 2024-03-05 MED ORDER — RIVAROXABAN 10 MG PO TABS: 10.0000 mg | ORAL_TABLET | Freq: Every day | ORAL | 0 refills | Status: DC

## 2024-03-05 MED ORDER — ACETAMINOPHEN 500 MG PO TABS: 1000.0000 mg | ORAL_TABLET | Freq: Four times a day (QID) | ORAL | 0 refills | Status: AC | PRN

## 2024-03-05 MED ORDER — GABAPENTIN 300 MG PO CAPS: 300.0000 mg | ORAL_CAPSULE | Freq: Three times a day (TID) | ORAL | 0 refills | Status: DC

## 2024-03-10 NOTE — Progress Notes (Signed)
 Patient called answering service, noticed this afternoon without pain or activity his heart racing no SOB or chest pain but BP < 160 and HR 100. Improved, BP now 120s/80s but pulse still 100. Hydrating and urination normal. No calf pain. Taking xarelto since Saturday. Foot is not painful. Discussed the MI or VTE is not likely but possible. He prefers to wait until visit in AM, I told him if any symptoms return he should go directly to ER or call 911

## 2024-03-11 ENCOUNTER — Encounter (HOSPITAL_COMMUNITY): Payer: Self-pay | Admitting: *Deleted

## 2024-03-11 ENCOUNTER — Ambulatory Visit (INDEPENDENT_AMBULATORY_CARE_PROVIDER_SITE_OTHER)

## 2024-03-11 ENCOUNTER — Emergency Department (HOSPITAL_COMMUNITY)
Admission: EM | Admit: 2024-03-11 | Discharge: 2024-03-11 | Discharge status: Against Medical Advice | Attending: Emergency Medicine | Admitting: Emergency Medicine

## 2024-03-11 ENCOUNTER — Encounter: Payer: Self-pay | Admitting: Podiatry

## 2024-03-11 ENCOUNTER — Emergency Department (HOSPITAL_COMMUNITY)

## 2024-03-11 ENCOUNTER — Ambulatory Visit (INDEPENDENT_AMBULATORY_CARE_PROVIDER_SITE_OTHER): Payer: BC Managed Care – PPO | Admitting: Podiatry

## 2024-03-11 ENCOUNTER — Other Ambulatory Visit: Payer: Self-pay

## 2024-03-11 VITALS — BP 139/66 | HR 79 | Temp 98.9°F | Resp 20 | Ht 72.0 in | Wt 180.4 lb

## 2024-03-11 DIAGNOSIS — Z7901 Long term (current) use of anticoagulants: Secondary | ICD-10-CM | POA: Diagnosis not present

## 2024-03-11 DIAGNOSIS — Z5321 Procedure and treatment not carried out due to patient leaving prior to being seen by health care provider: Secondary | ICD-10-CM | POA: Insufficient documentation

## 2024-03-11 DIAGNOSIS — Z9889 Other specified postprocedural states: Secondary | ICD-10-CM | POA: Diagnosis not present

## 2024-03-11 DIAGNOSIS — R002 Palpitations: Secondary | ICD-10-CM | POA: Insufficient documentation

## 2024-03-11 LAB — CBC
HCT: 36.1 % — ABNORMAL LOW (ref 39.0–52.0)
Hemoglobin: 12.5 g/dL — ABNORMAL LOW (ref 13.0–17.0)
MCH: 32.5 pg (ref 26.0–34.0)
MCHC: 34.6 g/dL (ref 30.0–36.0)
MCV: 93.8 fL (ref 80.0–100.0)
Platelets: 185 10*3/uL (ref 150–400)
RBC: 3.85 MIL/uL — ABNORMAL LOW (ref 4.22–5.81)
RDW: 12.7 % (ref 11.5–15.5)
WBC: 7.7 10*3/uL (ref 4.0–10.5)
nRBC: 0 % (ref 0.0–0.2)

## 2024-03-11 LAB — T4, FREE: Free T4: 1.03 ng/dL (ref 0.61–1.12)

## 2024-03-11 LAB — BASIC METABOLIC PANEL WITH GFR
Anion gap: 13 (ref 5–15)
BUN: 17 mg/dL (ref 8–23)
CO2: 24 mmol/L (ref 22–32)
Calcium: 8.7 mg/dL — ABNORMAL LOW (ref 8.9–10.3)
Chloride: 98 mmol/L (ref 98–111)
Creatinine, Ser: 0.99 mg/dL (ref 0.61–1.24)
GFR, Estimated: >60 mL/min (ref >60)
Glucose, Bld: 112 mg/dL — ABNORMAL HIGH (ref 70–99)
Potassium: 3.7 mmol/L (ref 3.5–5.1)
Sodium: 135 mmol/L (ref 135–145)

## 2024-03-11 LAB — TSH: TSH: 4.634 u[IU]/mL — ABNORMAL HIGH (ref 0.350–4.500)

## 2024-03-11 LAB — MAGNESIUM: Magnesium: 1.6 mg/dL — ABNORMAL LOW (ref 1.7–2.4)

## 2024-03-11 NOTE — Progress Notes (Signed)
  Subjective:  Patient ID: Raymond Wagner, male    DOB: 1956-06-20,  MRN: [id number]  Chief Complaint  Patient presents with   Routine Post Op    Pt is here for routine post op #1 after surgery to right foot, pt states everything is fine no complaints.    DOS: 03/05/2024 Procedure: Right foot Lapidus, hammertoe correction x 2, Weil osteotomy x 2, flexor tenotomy x 2, bone graft from heel, gastrocnemius recession  68 y.o. male returns for post-op check.  Did have some heart racing again around 5 AM but not as bad as it was yesterday.  No shortness of breath or chest pain.  Review of Systems: Negative except as noted in the HPI. Denies N/V/F/Ch.   Objective:   Vitals:   03/11/24 0857 03/11/24 0934  BP: (!) 140/73 139/66  Pulse: 79   Resp: 20   Temp: 98.9 F (37.2 C)   SpO2: 97%    Body mass index is 24.47 kg/m. Constitutional Well developed. Well nourished.  Vascular Foot warm and well perfused. Capillary refill normal to all digits.  Calf is soft and supple, no posterior calf or knee pain, negative Homans' sign  Neurologic Normal speech. Oriented to person, place, and time. Epicritic sensation to light touch grossly present bilaterally.  Dermatologic Skin healing well without signs of infection. Skin edges well coapted without signs of infection.  Moderate bruising.  Some rash in first interspace.  Maceration of posterior heel  Orthopedic: Tenderness to palpation noted about the surgical site.   Multiple view plain film radiographs: Good correction noted all hardware and intact and equivalent to immediate postoperative films Assessment:   1. Status post right foot surgery    Plan:  Patient was evaluated and treated and all questions answered.  S/p foot surgery right - X-ray reviewed.  No hardware complications.  Has a moderate amount of edema as expected, his posterior heel does have some maceration likely bleeding from the bone graft incision on the heel.  Betadine  applied to all incisions and this part of the heel.  Recommend removing the boot and offloading the heel while awake.  Continue use of boot at night.  Otherwise able to remove boot and perform range of motion.  Regarding his hypertension and blood pressure we checked his blood pressure twice a day and he was still mildly hypertensive.  He has not had a history of this before, he does have a family history of A-fib in his mother.  No overt symptoms of MI cardiac event or VTE.  Has been taking his Xarelto with the exception of today.  Not having any pain and he should continue using only Tylenol at this point and discontinue oxycodone and gabapentin.  Considering these issues I did recommend evaluation in the emergency room for EKG lab work and chest x-ray so that he can be evaluated for occult MI VTE or arrhythmia.  We discussed if all this is negative it is likely dehydration and he should follow-up with PCP afterwards.  He will proceed to the ER today.  No follow-ups on file.

## 2024-03-11 NOTE — ED Provider Triage Note (Signed)
 Emergency Medicine Provider Triage Evaluation Note  Raymond Wagner , a 68 y.o. male  was evaluated in triage.  Pt complains of palpitations. Had foot surgery last Friday and is currently on tylenol, xarelto, and gabapentin. Last night his heart was racing. Cuff monitor was saying his HR was giving various readings with one being 168 bpm. Other were 118 bpm. Told to come to the emergency department by the podiatrist. No hx of AF or arrhythmia. On xarelto for dvt ppx.   Review of Systems  Positive: Palpitations Negative: Cp, sob  Physical Exam  BP (!) 153/81 (BP Location: Right Arm)   Pulse 81   Temp 98.7 F (37.1 C) (Oral)   Resp 20   SpO2 98%  Gen:   Awake, no distress   Resp:  Normal effort  MSK:   Moves extremities without difficulty  Other:    Medical Decision Making  Medically screening exam initiated at 11:00 AM.  Appropriate orders placed.  Yaakov Saindon was informed that the remainder of the evaluation will be completed by another provider, this initial triage assessment does not replace that evaluation, and the importance of remaining in the ED until their evaluation is complete.   Ninetta Basket, MD 03/11/24 [phone]

## 2024-03-11 NOTE — ED Triage Notes (Signed)
 Pt had right foot surgery last Friday and is on xarelto and gabapentin.  Pt began feeling like his heart was racing last pm and had some nausea this am.  Pt denies any pain with this.  No feeling of heart racing at this time.

## 2024-03-11 NOTE — ED Notes (Signed)
 Pt did not respond when called for vitals or rollcall

## 2024-03-13 ENCOUNTER — Encounter: Payer: Self-pay | Admitting: Podiatry

## 2024-03-13 MED ORDER — METOCLOPRAMIDE HCL 10 MG PO TABS
10.0000 mg | ORAL_TABLET | Freq: Four times a day (QID) | ORAL | 0 refills | Status: AC | PRN
Start: 2024-03-13 — End: ?

## 2024-03-13 NOTE — Progress Notes (Signed)
 Patient called Answering Service still having blood pressure around 139/88 and pulse around 85 says he feels nauseous and can't eat or drink and just doesn't feel good. He did go to the ER after our visit and had lab work and chest x-ray and EKG done which showed some electrolyte abnormalities, he left before evaluation of the results by a physician because of the long wait, but he spoke with his PCP who is a personal friend and they reviewed the results and they were not concerned about anything other than that he is likely dehydrated inquired if his Xarelto  is causing this and I discussed with him that I don't think that is likely affecting his pressure heart rate or causing the nausea. He denied any bleeding issues. I sent him an RX for Reglan  10 mg to use PRN. Discussed if that does not help then he can discontinue the Xarelto  and switch to aspirin twice daily for DVT prophylaxis

## 2024-03-15 ENCOUNTER — Encounter: Payer: Self-pay | Admitting: Podiatry

## 2024-03-24 ENCOUNTER — Telehealth: Payer: Self-pay | Admitting: Podiatry

## 2024-03-24 NOTE — Telephone Encounter (Signed)
 [employer] called stating they are faxing a prior auth form for pts medication for Dr Michalene Agee to fill out and fax back.

## 2024-03-25 ENCOUNTER — Encounter: Payer: Self-pay | Admitting: Podiatry

## 2024-03-25 ENCOUNTER — Ambulatory Visit (INDEPENDENT_AMBULATORY_CARE_PROVIDER_SITE_OTHER): Payer: BC Managed Care – PPO | Admitting: Podiatry

## 2024-03-25 VITALS — Ht 72.0 in | Wt 180.0 lb

## 2024-03-25 DIAGNOSIS — Z9889 Other specified postprocedural states: Secondary | ICD-10-CM

## 2024-03-25 NOTE — Progress Notes (Signed)
  Subjective:  Patient ID: Raymond Wagner, male    DOB: Apr 03, 1956,  MRN: [id number]  Chief Complaint  Patient presents with   Routine Post Op    POV # 2 DOS 03/05/24 --- BUNION CORRECTION WITH JOINT FUSION MIDFFOT, HAMMERTOE CORRECTION 2-5, SHORTENING METATARSALS 2,3, BONE CUT BIG TOE AND CALF LENGTHENING    DOS: 03/05/2024 Procedure: Right foot Lapidus, hammertoe correction x 2, Weil osteotomy x 2, flexor tenotomy x 2, bone graft from heel, gastrocnemius recession  68 y.o. male returns for post-op check  Review of Systems: Negative except as noted in the HPI. Denies N/V/F/Ch.   Objective:   There were no vitals filed for this visit.  Body mass index is 24.41 kg/m. Constitutional Well developed. Well nourished.  Vascular Foot warm and well perfused. Capillary refill normal to all digits.  Calf is soft and supple, no posterior calf or knee pain, negative Homans' sign  Neurologic Normal speech. Oriented to person, place, and time. Epicritic sensation to light touch grossly present bilaterally.  Dermatologic Skin healing well without signs of infection. Skin edges well coapted without signs of infection.  Rash bruising and maceration has improved  Orthopedic: Tenderness to palpation noted about the surgical site.   Multiple view plain film radiographs: Good correction noted all hardware and intact and equivalent to immediate postoperative films Assessment:   1. Status post right foot surgery    Plan:  Patient was evaluated and treated and all questions answered.  S/p foot surgery right - Overall still feeling tired but improving.  Sutures removed uneventfully.  Continue range of motion of ankle and toes and MTP joints.  May gradually begin heel weightbearing and continue to transition over the next few weeks gradually to full weightbearing.  Return in 3 weeks for new radiographs may be able to transition to surgical shoe after that  Return in about 3 weeks (around 04/15/2024)  for post op (new x-rays).

## 2024-03-26 ENCOUNTER — Encounter: Payer: Self-pay | Admitting: Podiatry

## 2024-03-26 NOTE — Telephone Encounter (Signed)
 I have poke to patient and answered questions he needed clarity on regards to his range of motion and gradually using full weight on his foot.

## 2024-04-02 ENCOUNTER — Encounter: Payer: Self-pay | Admitting: Podiatry

## 2024-04-05 ENCOUNTER — Telehealth: Payer: Self-pay | Admitting: Podiatry

## 2024-04-05 NOTE — Telephone Encounter (Signed)
 PT had POV3 on 04/12/2024 and rescheduled him for 04/20/2024 per pt request to stay with you. Will this date be okay for POV3?

## 2024-04-05 NOTE — Telephone Encounter (Signed)
 [facility]on[facility]) called regarding prescription change order request. Ref# [id number]/Contact telephone number, [phone]/Patient, Raymond Wagner/ DOB: 21-Dec-1955/MRN: [id number]

## 2024-04-12 ENCOUNTER — Encounter: Admitting: Podiatry

## 2024-04-13 ENCOUNTER — Encounter: Payer: BC Managed Care – PPO | Admitting: Podiatry

## 2024-04-20 ENCOUNTER — Ambulatory Visit: Admitting: Podiatry

## 2024-04-20 ENCOUNTER — Ambulatory Visit (INDEPENDENT_AMBULATORY_CARE_PROVIDER_SITE_OTHER)

## 2024-04-20 ENCOUNTER — Ambulatory Visit (INDEPENDENT_AMBULATORY_CARE_PROVIDER_SITE_OTHER): Admitting: Podiatry

## 2024-04-20 DIAGNOSIS — Z9889 Other specified postprocedural states: Secondary | ICD-10-CM

## 2024-04-20 DIAGNOSIS — M2011 Hallux valgus (acquired), right foot: Secondary | ICD-10-CM

## 2024-04-20 DIAGNOSIS — M21611 Bunion of right foot: Secondary | ICD-10-CM

## 2024-04-21 ENCOUNTER — Encounter: Payer: Self-pay | Admitting: Podiatry

## 2024-04-21 MED ORDER — MUPIROCIN 2 % EX OINT: 1.0000 | TOPICAL_OINTMENT | Freq: Every day | CUTANEOUS | 2 refills | Status: AC

## 2024-04-21 MED ORDER — AMOXICILLIN-POT CLAVULANATE 875-125 MG PO TABS: 1.0000 | ORAL_TABLET | Freq: Two times a day (BID) | ORAL | 0 refills | Status: AC

## 2024-04-24 ENCOUNTER — Encounter: Payer: Self-pay | Admitting: Podiatry

## 2024-04-24 NOTE — Progress Notes (Signed)
  Subjective:  Patient ID: Christophor Eick, male    DOB: May 19, 1956,  MRN: [id number]  Chief Complaint  Patient presents with   Routine Post Op    POV # 3 DOS 03/05/24 --- BUNION CORRECTION WITH JOINT FUSION MIDFFOT, HAMMERTOE CORRECTION 2-5, SHORTENING METATARSALS 2,3, BONE CUT BIG TOE AND CALF LENGTHENING    DOS: 03/05/2024 Procedure: Right foot Lapidus, hammertoe correction x 2, Weil osteotomy x 2, flexor tenotomy x 2, bone graft from heel, gastrocnemius recession  68 y.o. male returns for post-op check  Review of Systems: Negative except as noted in the HPI. Denies N/V/F/Ch.   Objective:   There were no vitals filed for this visit.  There is no height or weight on file to calculate BMI. Constitutional Well developed. Well nourished.  Vascular Foot warm and well perfused. Capillary refill normal to all digits.  Calf is soft and supple, no posterior calf or knee pain, negative Homans' sign  Neurologic Normal speech. Oriented to person, place, and time. Epicritic sensation to light touch grossly present bilaterally.  Dermatologic Insert is well-healed except for small skin breakdown superficial of the first interspace incision no active drainage  Orthopedic: Tenderness to palpation noted about the surgical site.   Multiple view plain film radiographs: Good correction noted all hardware and intact and good early consolidation across fusion and osteotomy sites Assessment:   1. Hallux valgus with bunions, right   2. Status post right foot surgery    Plan:  Patient was evaluated and treated and all questions answered.  S/p foot surgery right - For bony standpoint is fairly well-healed.  Surgical shoe dispensed and he may transition to weightbearing in this.  Continue range of motion of the digits.  Has small area of delayed healing of the first MTP interspace incision.  Rx for Augmentin  and Bactroban  sent to pharmacy discussed worsening signs of infection of which she currently has  none he will notify me if this develops.  Otherwise return in 4 weeks for new radiographs then transition back to regular shoe gear.  Return in about 4 weeks (around 05/18/2024) for post op (new x-rays).

## 2024-04-29 ENCOUNTER — Telehealth: Payer: Self-pay | Admitting: Podiatry

## 2024-04-29 NOTE — Telephone Encounter (Signed)
 Tina from [employer], received prescription order and would like for Dr. Michalene Agee to review and check whether it is approved or declined. Fax# [phone]/telephone number, [phone] (Patient, Raymond Wagner) MRN: [id number]

## 2024-05-18 ENCOUNTER — Encounter: Payer: Self-pay | Admitting: Podiatry

## 2024-05-18 ENCOUNTER — Ambulatory Visit (INDEPENDENT_AMBULATORY_CARE_PROVIDER_SITE_OTHER): Admitting: Podiatry

## 2024-05-18 ENCOUNTER — Ambulatory Visit (INDEPENDENT_AMBULATORY_CARE_PROVIDER_SITE_OTHER)

## 2024-05-18 DIAGNOSIS — Z9889 Other specified postprocedural states: Secondary | ICD-10-CM

## 2024-05-18 DIAGNOSIS — M2011 Hallux valgus (acquired), right foot: Secondary | ICD-10-CM

## 2024-05-18 DIAGNOSIS — M21611 Bunion of right foot: Secondary | ICD-10-CM

## 2024-05-18 NOTE — Progress Notes (Signed)
  Subjective:  Patient ID: Raymond Wagner, male    DOB: [birth year],  MRN: [id number]  Chief Complaint  Patient presents with   Routine Post Op    rm1DOS 03/05/24 --- BUNION CORRECTION WITH JOINT FUSION MIDFFOT, HAMMERTOE CORRECTION 2-5, SHORTENING METATARSALS 2,3, BONE CUT BIG TOE AND CALF LENGTHENING/ Patient says he is doing well    DOS: 03/05/2024 Procedure: Right foot Lapidus, hammertoe correction x 2, Weil osteotomy x 2, flexor tenotomy x 2, bone graft from heel, gastrocnemius recession  68 y.o. male returns for post-op check.  Overall doing well still has some swelling but no pain  Review of Systems: Negative except as noted in the HPI. Denies N/V/F/Ch.   Objective:   There were no vitals filed for this visit.  There is no height or weight on file to calculate BMI. Constitutional Well developed. Well nourished.  Vascular Foot warm and well perfused. Capillary refill normal to all digits.  Calf is soft and supple, no posterior calf or knee pain, negative Homans' sign  Neurologic Normal speech. Oriented to person, place, and time. Epicritic sensation to light touch grossly present bilaterally.  Dermatologic Incisions are well-healed and not hypertrophic  Orthopedic: No pain to palpation noted about the surgical site.  Good range of motion of MTP.  Mild edema   Multiple view plain film radiographs: He has complete consolidation across fusion and osteotomy site with good correction noted Assessment:   1. Hallux valgus with bunions, right    Plan:  Patient was evaluated and treated and all questions answered.  S/p foot surgery right - Remains fully healed.  Return to regular shoe gear and full activity as tolerated.  Okay to return to work full duty next week I discussed with him if he is having difficulty being on his feet for a long period of time walking, etc. that we could consider reduced schedule and he will let me know if he needs this restriction.  Follow-up with me as  needed for this if further issues, we discussed the edema and occasional neuritic pain is not uncommon for the next couple of months still.  Expect this to be fully resolved by the 7-month mark Return if symptoms worsen or fail to improve.

## 2024-05-19 DIAGNOSIS — Z0271 Encounter for disability determination: Secondary | ICD-10-CM

## 2024-05-19 NOTE — Telephone Encounter (Signed)
 Faxed [employer] form/notes/letter to [phone]

## 2024-06-17 NOTE — Telephone Encounter (Signed)
 Error

## 2024-11-15 ENCOUNTER — Other Ambulatory Visit: Payer: Self-pay | Admitting: Urology

## 2024-11-29 NOTE — Progress Notes (Signed)
 Anesthesia Review:  PCP: Cardiologist :  PPM/ ICD: Device Orders: Rep Notified:  Chest x-ray : 03/11/24-2 view  EKG : 03/12/24  Echo : Stress test: Cardiac Cath :   Activity level:  Sleep Study/ CPAP : Fasting Blood Sugar :      / Checks Blood Sugar -- times a day:    Blood Thinner/ Instructions /Last Dose: ASA / Instructions/ Last Dose :    Xarelto 

## 2024-11-30 NOTE — Patient Instructions (Signed)
 SURGICAL WAITING ROOM VISITATION  Patients having surgery or a procedure may have no more than 2 support people in the waiting area - these visitors may rotate.    Children ages 59 and under will not be able to visit patients in [facility] under most circumstances.   Visitors with respiratory illnesses are discouraged from visiting and should remain at home.  If the patient needs to stay at the hospital during part of their recovery, the visitor guidelines for inpatient rooms apply. Pre-op nurse will coordinate an appropriate time for 1 support person to accompany patient in pre-op.  This support person may not rotate.    Please refer to the [facility] website for the visitor guidelines for Inpatients (after your surgery is over and you are in a regular room).       Your procedure is scheduled on:   12/10/2024    Report to [facility] Main Entrance    Report to admitting at  0515 AM   Call this number if you have problems the morning of surgery [phone]   Do not eat food  or drink liquids After Midnight.                               If you have questions, please contact your surgeons office.      Oral Hygiene is also important to reduce your risk of infection.                                    Remember - BRUSH YOUR TEETH THE MORNING OF SURGERY WITH YOUR REGULAR TOOTHPASTE  DENTURES WILL BE REMOVED PRIOR TO SURGERY PLEASE DO NOT APPLY Poly grip OR ADHESIVES!!!   Do NOT smoke after Midnight   Stop all vitamins and herbal supplements 7 days before surgery.   Take these medicines the morning of surgery with A SIP OF WATER:  none   DO NOT TAKE ANY ORAL DIABETIC MEDICATIONS DAY OF YOUR SURGERY  Bring CPAP mask and tubing day of surgery.                              You may not have any metal on your body including hair pins, jewelry, and body piercing             Do not wear make-up, lotions, powders, perfumes/cologne, or deodorant  Do not  wear nail polish including gel and S&S, artificial/acrylic nails, or any other type of covering on natural nails including finger and toenails. If you have artificial nails, gel coating, etc. that needs to be removed by a nail salon please have this removed prior to surgery or surgery may need to be canceled/ delayed if the surgeon/ anesthesia feels like they are unable to be safely monitored.   Do not shave  48 hours prior to surgery.               Men may shave face and neck.   Do not bring valuables to the hospital. [facility] IS NOT             RESPONSIBLE   FOR VALUABLES.   Contacts, glasses, dentures or bridgework may not be worn into surgery.   Bring small overnight bag day of surgery.   DO NOT [facility]  MEDICATIONS TO THE HOSPITAL. PHARMACY WILL DISPENSE MEDICATIONS LISTED ON YOUR MEDICATION LIST TO YOU DURING YOUR ADMISSION IN THE HOSPITAL!    Patients discharged on the day of surgery will not be allowed to drive home.  Someone NEEDS to stay with you for the first 24 hours after anesthesia.   Special Instructions: Bring a copy of your healthcare power of attorney and living will documents the day of surgery if you haven't scanned them before.              Please read over the following fact sheets you were given: IF YOU HAVE QUESTIONS ABOUT YOUR PRE-OP INSTRUCTIONS PLEASE CALL [phone].   If you received a COVID test during your pre-op visit  it is requested that you wear a mask when out in public, stay away from anyone that may not be feeling well and notify your surgeon if you develop symptoms. If you test positive for Covid or have been in contact with anyone that has tested positive in the last 10 days please notify you surgeon.    [place] - Preparing for Surgery Before surgery, you can play an important role.  Because skin is not sterile, your skin needs to be as free of germs as possible.  You can reduce the number of germs on your skin by washing with CHG  (chlorahexidine gluconate) soap before surgery.  CHG is an antiseptic cleaner which kills germs and bonds with the skin to continue killing germs even after washing. Please DO NOT use if you have an allergy to CHG or antibacterial soaps.  If your skin becomes reddened/irritated stop using the CHG and inform your nurse when you arrive at Short Stay. Do not shave (including legs and underarms) for at least 48 hours prior to the first CHG shower.  You may shave your face/neck. Please follow these instructions carefully:  1.  Shower with CHG Soap the night before surgery and the  morning of Surgery.  2.  If you choose to wash your hair, wash your hair first as usual with your  normal  shampoo.  3.  After you shampoo, rinse your hair and body thoroughly to remove the  shampoo.                           4.  Use CHG as you would any other liquid soap.  You can apply chg directly  to the skin and wash                       Gently with a scrungie or clean washcloth.  5.  Apply the CHG Soap to your body ONLY FROM THE NECK DOWN.   Do not use on face/ open                           Wound or open sores. Avoid contact with eyes, ears mouth and genitals (private parts).                       Wash face,  Genitals (private parts) with your normal soap.             6.  Wash thoroughly, paying special attention to the area where your surgery  will be performed.  7.  Thoroughly rinse your body with warm water from the neck down.  8.  DO NOT shower/wash with  your normal soap after using and rinsing off  the CHG Soap.                9.  Pat yourself dry with a clean towel.            10.  Wear clean pajamas.            11.  Place clean sheets on your bed the night of your first shower and do not  sleep with pets. Day of Surgery : Do not apply any lotions/deodorants the morning of surgery.  Please wear clean clothes to the hospital/surgery center.  FAILURE TO FOLLOW THESE INSTRUCTIONS MAY RESULT IN THE CANCELLATION OF  YOUR SURGERY PATIENT SIGNATURE_________________________________  NURSE SIGNATURE__________________________________  ________________________________________________________________________

## 2024-12-01 ENCOUNTER — Other Ambulatory Visit: Payer: Self-pay

## 2024-12-01 ENCOUNTER — Encounter (HOSPITAL_COMMUNITY): Payer: Self-pay

## 2024-12-01 ENCOUNTER — Encounter (HOSPITAL_COMMUNITY)
Admission: RE | Admit: 2024-12-01 | Discharge: 2024-12-01 | Disposition: A | Discharge status: Home / Self Care | Source: Ambulatory Visit | Attending: Urology | Admitting: Urology

## 2024-12-01 VITALS — BP 152/83 | HR 71 | Temp 98.3°F | Resp 16 | Ht 73.0 in | Wt 188.0 lb

## 2024-12-01 DIAGNOSIS — Z01812 Encounter for preprocedural laboratory examination: Secondary | ICD-10-CM | POA: Insufficient documentation

## 2024-12-01 DIAGNOSIS — Z01818 Encounter for other preprocedural examination: Secondary | ICD-10-CM

## 2024-12-01 HISTORY — DX: Unspecified osteoarthritis, unspecified site: M19.90

## 2024-12-01 HISTORY — DX: Anxiety disorder, unspecified: F41.9

## 2024-12-01 LAB — CBC
HCT: 39.2 % (ref 39.0–52.0)
Hemoglobin: 13.6 g/dL (ref 13.0–17.0)
MCH: 32.7 pg (ref 26.0–34.0)
MCHC: 34.7 g/dL (ref 30.0–36.0)
MCV: 94.2 fL (ref 80.0–100.0)
Platelets: 172 K/uL (ref 150–400)
RBC: 4.16 MIL/uL — ABNORMAL LOW (ref 4.22–5.81)
RDW: 13 % (ref 11.5–15.5)
WBC: 6.6 K/uL (ref 4.0–10.5)
nRBC: 0 % (ref 0.0–0.2)

## 2024-12-01 LAB — BASIC METABOLIC PANEL WITH GFR
Anion gap: 11 (ref 5–15)
BUN: 24 mg/dL — ABNORMAL HIGH (ref 8–23)
CO2: 27 mmol/L (ref 22–32)
Calcium: 9.2 mg/dL (ref 8.9–10.3)
Chloride: 100 mmol/L (ref 98–111)
Creatinine, Ser: 1.09 mg/dL (ref 0.61–1.24)
GFR, Estimated: >60 mL/min
Glucose, Bld: 131 mg/dL — ABNORMAL HIGH (ref 70–99)
Potassium: 4.7 mmol/L (ref 3.5–5.1)
Sodium: 138 mmol/L (ref 135–145)

## 2024-12-02 ENCOUNTER — Encounter (HOSPITAL_COMMUNITY): Payer: Self-pay | Admitting: Medical

## 2024-12-02 ENCOUNTER — Encounter (HOSPITAL_COMMUNITY): Payer: Self-pay | Admitting: Anesthesiology

## 2024-12-10 ENCOUNTER — Encounter (HOSPITAL_COMMUNITY): Admission: RE | Payer: Self-pay | Source: Ambulatory Visit

## 2024-12-10 ENCOUNTER — Ambulatory Visit (HOSPITAL_COMMUNITY): Admission: RE | Admit: 2024-12-10 | Source: Ambulatory Visit | Admitting: Urology

## 2024-12-10 SURGERY — TURBT (TRANSURETHRAL RESECTION OF BLADDER TUMOR)
Anesthesia: General

## 2024-12-16 ENCOUNTER — Other Ambulatory Visit: Payer: Self-pay | Admitting: Urology

## 2025-01-14 ENCOUNTER — Ambulatory Visit (HOSPITAL_COMMUNITY): Admit: 2025-01-14 | Admitting: Urology

## 2025-01-14 SURGERY — TURBT, WITH CHEMOTHERAPEUTIC AGENT INSTILLATION INTO BLADDER
Anesthesia: General
